# Patient Record
Sex: Male | Born: 1995 | Race: Black or African American | Hispanic: No | Smoking: Never smoker
Health system: Southern US, Community
[De-identification: ages and names within clinical notes are randomized; demographics above are authoritative.]

## PROBLEM LIST (undated history)

## (undated) DIAGNOSIS — J45909 Unspecified asthma, uncomplicated: Secondary | ICD-10-CM

## (undated) DIAGNOSIS — I517 Cardiomegaly: Secondary | ICD-10-CM

## (undated) HISTORY — PX: FINGER SURGERY: SHX640

---
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
## 2013-11-15 ENCOUNTER — Encounter (HOSPITAL_COMMUNITY): Payer: Self-pay | Admitting: Emergency Medicine

## 2013-11-15 ENCOUNTER — Emergency Department (HOSPITAL_COMMUNITY)
Admission: EM | Admit: 2013-11-15 | Discharge: 2013-11-15 | Disposition: A | Discharge status: Home / Self Care | Payer: Medicaid Other | Attending: Emergency Medicine | Admitting: Emergency Medicine

## 2013-11-15 DIAGNOSIS — Z202 Contact with and (suspected) exposure to infections with a predominantly sexual mode of transmission: Secondary | ICD-10-CM | POA: Insufficient documentation

## 2013-11-15 LAB — RPR: RPR: NONREACTIVE

## 2013-11-15 MED ORDER — PENICILLIN G BENZATHINE 1200000 UNIT/2ML IM SUSP
2.4000 10*6.[IU] | Freq: Once | INTRAMUSCULAR | Status: AC
  Administered 2013-11-15: 2.4 10*6.[IU] via INTRAMUSCULAR
  Filled 2013-11-15: qty 4

## 2013-11-15 NOTE — Discharge Instructions (Signed)
No intercourse for a week. Your tests will be back in 2-3 days. You will be called if abnormal.    Sexually Transmitted Disease A sexually transmitted disease (STD) is a disease or infection that may be passed (transmitted) from person to person, usually during sexual activity. This may happen by way of saliva, semen, blood, vaginal mucus, or urine. Common STDs include:   Gonorrhea.   Chlamydia.   Syphilis.   HIV and AIDS.   Genital herpes.   Hepatitis B and C.   Trichomonas.   Human papillomavirus (HPV).   Pubic lice.   Scabies.  Mites.  Bacterial vaginosis. WHAT ARE CAUSES OF STDs? An STD may be caused by bacteria, a virus, or parasites. STDs are often transmitted during sexual activity if one person is infected. However, they may also be transmitted through nonsexual means. STDs may be transmitted after:   Sexual intercourse with an infected person.   Sharing sex toys with an infected person.   Sharing needles with an infected person or using unclean piercing or tattoo needles.  Having intimate contact with the genitals, mouth, or rectal areas of an infected person.   Exposure to infected fluids during birth. WHAT ARE THE SIGNS AND SYMPTOMS OF STDs? Different STDs have different symptoms. Some people may not have any symptoms. If symptoms are present, they may include:   Painful or bloody urination.   Pain in the pelvis, abdomen, vagina, anus, throat, or eyes.   Skin rash, itching, irritation, growths, sores (lesions), ulcerations, or warts in the genital or anal area.  Abnormal vaginal discharge with or without bad odor.   Penile discharge in men.   Fever.   Pain or bleeding during sexual intercourse.   Swollen glands in the groin area.   Yellow skin and eyes (jaundice). This is seen with hepatitis.   Swollen testicles.  Infertility.  Sores and blisters in the mouth. HOW ARE STDs DIAGNOSED? To make a diagnosis, your health  care provider may:   Take a medical history.   Perform a physical exam.   Take a sample of any discharge for examination.  Swab the throat, cervix, opening to the penis, rectum, or vagina for testing.  Test a sample of your first morning urine.   Perform blood tests.   Perform a Pap smear, if this applies.   Perform a colposcopy.   Perform a laparoscopy.  HOW ARE STDs TREATED? Treatment depends on the STD. Some STDs may be treated but not cured.   Chlamydia, gonorrhea, trichomonas, and syphilis can be cured with antibiotics.   Genital herpes, hepatitis, and HIV can be treated, but not cured, with prescribed medicines. The medicines lessen symptoms.   Genital warts from HPV can be treated with medicine or by freezing, burning (electrocautery), or surgery. Warts may come back.   HPV cannot be cured with medicine or surgery. However, abnormal areas may be removed from the cervix, vagina, or vulva.   If your diagnosis is confirmed, your recent sexual partners need treatment. This is true even if they are symptom-free or have a negative culture or evaluation. They should not have sex until their health care providers say it is OK. HOW CAN I REDUCE MY RISK OF GETTING AN STD?  Use latex condoms, dental dams, and water-soluble lubricants during sexual activity. Do not use petroleum jelly or oils.  Get vaccinated for HPV and hepatitis. If you have not received these vaccines in the past, talk to your health care provider about whether  one or both might be right for you.   Avoid risky sex practices that can break the skin.  WHAT SHOULD I DO IF I THINK I HAVE AN STD?  See your health care provider.   Inform all sexual partners. They should be tested and treated for any STDs.  Do not have sex until your health care provider says it is OK. WHEN SHOULD I GET HELP? Seek immediate medical care if:  You develop severe abdominal pain.  You are a man and notice swelling  or pain in the testicles.  You are a woman and notice swelling or pain in your vagina. Document Released: 12/16/2002 Document Revised: 07/16/2013 Document Reviewed: 04/15/2013 [facility] CenterExitCare Patient Information 2014 [place]ExitCare, MarylandLLC.

## 2013-11-15 NOTE — ED Provider Notes (Signed)
CSN: [id number]     Arrival date & time 11/15/13  1516 History  This chart was scribed for Jaynie Crumbleatyana Kenzee Bassin, PA working with Flint MelterElliott L Wentz, MD by Quintella ReichertMatthew Underwood, ED Scribe. This patient was seen in room TR06C/TR06C and the patient's care was started at 3:46 PM.   Chief Complaint  Patient presents with  . Exposure to STD    The history is provided by the patient. No language interpreter was used.    HPI Comments: Alan Foley is a 18 y.o. male who presents to the Emergency Department complaining of possible exposure to syphilis.  Pt states that his girlfriend was recently told by her doctor that she may have syphilis.  He has had sexual intercourse with her recently.  Currently he denies any symptoms including penile discharge, itching, urinary symptoms, or pain to any area.   History reviewed. No pertinent past medical history.  History reviewed. No pertinent past surgical history.  History reviewed. No pertinent family history.   History  Substance Use Topics  . Smoking status: Not on file  . Smokeless tobacco: Not on file  . Alcohol Use: No     Review of Systems  Constitutional: Negative for fever.  Gastrointestinal: Negative for vomiting and abdominal pain.  Genitourinary: Negative for dysuria, frequency, hematuria, discharge, penile swelling, scrotal swelling, difficulty urinating, genital sores, penile pain and testicular pain.     Allergies  Review of patient's allergies indicates no known allergies.  Home Medications  No current outpatient prescriptions on file.  BP 145/79  Pulse 104  Temp(Src) 98.8 F (37.1 C) (Oral)  Resp 20  SpO2 100%  Physical Exam  Nursing note and vitals reviewed. Constitutional: He is oriented to person, place, and time. He appears well-developed and well-nourished. No distress.  HENT:  Head: Normocephalic and atraumatic.  Eyes: EOM are normal.  Neck: Neck supple. No tracheal deviation present.  Cardiovascular: Normal rate.    Pulmonary/Chest: Effort normal. No respiratory distress.  Genitourinary: Penis normal. No penile tenderness.  No penile discharge. No perineum lesions  Musculoskeletal: Normal range of motion.  Neurological: He is alert and oriented to person, place, and time.  Skin: Skin is warm and dry.  Psychiatric: He has a normal mood and affect. His behavior is normal.    ED Course  Procedures (including critical care time)  DIAGNOSTIC STUDIES: Oxygen Saturation is 100% on room air, normal by my interpretation.    COORDINATION OF CARE: 3:50 PM-Discussed treatment plan which includes Penicillin injection and labs with pt at bedside and pt agreed to plan.    Labs Review Labs Reviewed  GC/CHLAMYDIA PROBE AMP  RPR    Imaging Review No results found.  EKG Interpretation   None       MDM   1. Exposure to syphilis     Patient emergency department after his girlfriend told him that she tested positive for syphilis. I reviewed CDC guidelines, which recommend prophylactic treatment for exposed patient's. I did get RPR cultures, GC Chlamydia cultures. I treated him with 2.4 million units of Bicillin IM. Safe sex practices discussed. Discharge home with health department followup.  Filed Vitals:   11/15/13 1519 11/15/13 1521 11/15/13 1629  BP:  145/79 125/69  Pulse: 104  91  Temp: 98.8 F (37.1 C)  98.1 F (36.7 C)  TempSrc: Oral  Oral  Resp: 20  20  SpO2: 100%  99%      Lottie Musselatyana A Alejandria Wessells, PA-C 11/15/13 1644

## 2013-11-15 NOTE — ED Notes (Signed)
Pt reports possible exposure to syphillis, denies any penile discharge or symptoms.

## 2013-11-16 NOTE — ED Provider Notes (Signed)
Medical screening examination/treatment/procedure(s) were performed by non-physician practitioner and as supervising physician I was immediately available for consultation/collaboration.  Filed Vitals:   11/15/13 1519 11/15/13 1521 11/15/13 1629  BP:  145/79 125/69  Pulse: 104  91  Temp: 98.8 F (37.1 C)  98.1 F (36.7 C)  TempSrc: Oral  Oral  Resp: 20  20  SpO2: 100%  99%    Flint MelterElliott L Audryana Hockenberry, MD 11/16/13 [phone]

## 2013-11-17 LAB — GC/CHLAMYDIA PROBE AMP
CT Probe RNA: NEGATIVE
GC Probe RNA: NEGATIVE

## 2014-02-27 ENCOUNTER — Encounter (HOSPITAL_COMMUNITY): Payer: Self-pay | Admitting: Emergency Medicine

## 2014-02-27 ENCOUNTER — Emergency Department (HOSPITAL_COMMUNITY): Payer: Medicaid Other

## 2014-02-27 DIAGNOSIS — R079 Chest pain, unspecified: Secondary | ICD-10-CM | POA: Insufficient documentation

## 2014-02-27 DIAGNOSIS — M546 Pain in thoracic spine: Secondary | ICD-10-CM | POA: Insufficient documentation

## 2014-02-27 DIAGNOSIS — R29898 Other symptoms and signs involving the musculoskeletal system: Secondary | ICD-10-CM | POA: Insufficient documentation

## 2014-02-27 DIAGNOSIS — M25519 Pain in unspecified shoulder: Secondary | ICD-10-CM | POA: Insufficient documentation

## 2014-02-27 DIAGNOSIS — Z8679 Personal history of other diseases of the circulatory system: Secondary | ICD-10-CM | POA: Insufficient documentation

## 2014-02-27 LAB — BASIC METABOLIC PANEL
BUN: 11 mg/dL (ref 6–23)
CALCIUM: 10.1 mg/dL (ref 8.4–10.5)
CO2: 27 meq/L (ref 19–32)
CREATININE: 1.31 mg/dL (ref 0.50–1.35)
Chloride: 102 mEq/L (ref 96–112)
GFR calc Af Amer: 90 mL/min — ABNORMAL LOW (ref >90)
GFR, EST NON AFRICAN AMERICAN: 77 mL/min — AB (ref >90)
GLUCOSE: 92 mg/dL (ref 70–99)
Potassium: 3.7 mEq/L (ref 3.7–5.3)
SODIUM: 142 meq/L (ref 137–147)

## 2014-02-27 LAB — CBC
HEMATOCRIT: 41.2 % (ref 39.0–52.0)
HEMOGLOBIN: 14.9 g/dL (ref 13.0–17.0)
MCH: 23 pg — AB (ref 26.0–34.0)
MCHC: 36.2 g/dL — AB (ref 30.0–36.0)
MCV: 63.5 fL — ABNORMAL LOW (ref 78.0–100.0)
Platelets: 218 10*3/uL (ref 150–400)
RBC: 6.49 MIL/uL — ABNORMAL HIGH (ref 4.22–5.81)
RDW: 15.7 % — AB (ref 11.5–15.5)
WBC: 10.5 10*3/uL (ref 4.0–10.5)

## 2014-02-27 LAB — PRO B NATRIURETIC PEPTIDE: Pro B Natriuretic peptide (BNP): 28.4 pg/mL (ref 0–125)

## 2014-02-27 LAB — I-STAT TROPONIN, ED: Troponin i, poc: 0 ng/mL (ref 0.00–0.08)

## 2014-02-27 NOTE — ED Notes (Signed)
Pt states for about three weeks he has been having upper back pain and some tingling going in to left arm.  Pt states he is also having some chest pain once asked

## 2014-02-28 ENCOUNTER — Emergency Department (HOSPITAL_COMMUNITY)
Admission: EM | Admit: 2014-02-28 | Discharge: 2014-02-28 | Disposition: A | Discharge status: Home / Self Care | Payer: Medicaid Other | Attending: Emergency Medicine | Admitting: Emergency Medicine

## 2014-02-28 DIAGNOSIS — R079 Chest pain, unspecified: Secondary | ICD-10-CM

## 2014-02-28 HISTORY — DX: Cardiomegaly: I51.7

## 2014-02-28 NOTE — Discharge Instructions (Signed)
Chest Pain (Nonspecific) °It is often hard to give a specific diagnosis for the cause of chest pain. There is always a chance that your pain could be related to something serious, such as a heart attack or a blood clot in the lungs. You need to follow up with your caregiver for further evaluation. °CAUSES  °· Heartburn. °· Pneumonia or bronchitis. °· Anxiety or stress. °· Inflammation around your heart (pericarditis) or lung (pleuritis or pleurisy). °· A blood clot in the lung. °· A collapsed lung (pneumothorax). It can develop suddenly on its own (spontaneous pneumothorax) or from injury (trauma) to the chest. °· Shingles infection (herpes zoster virus). °The chest wall is composed of bones, muscles, and cartilage. Any of these can be the source of the pain. °· The bones can be bruised by injury. °· The muscles or cartilage can be strained by coughing or overwork. °· The cartilage can be affected by inflammation and become sore (costochondritis). °DIAGNOSIS  °Lab tests or other studies, such as X-rays, electrocardiography, stress testing, or cardiac imaging, may be needed to find the cause of your pain.  °TREATMENT  °· Treatment depends on what may be causing your chest pain. Treatment may include: °· Acid blockers for heartburn. °· Anti-inflammatory medicine. °· Pain medicine for inflammatory conditions. °· Antibiotics if an infection is present. °· You may be advised to change lifestyle habits. This includes stopping smoking and avoiding alcohol, caffeine, and chocolate. °· You may be advised to keep your head raised (elevated) when sleeping. This reduces the chance of acid going backward from your stomach into your esophagus. °· Most of the time, nonspecific chest pain will improve within 2 to 3 days with rest and mild pain medicine. °HOME CARE INSTRUCTIONS  °· If antibiotics were prescribed, take your antibiotics as directed. Finish them even if you start to feel better. °· For the next few days, avoid physical  activities that bring on chest pain. Continue physical activities as directed. °· Do not smoke. °· Avoid drinking alcohol. °· Only take over-the-counter or prescription medicine for pain, discomfort, or fever as directed by your caregiver. °· Follow your caregiver's suggestions for further testing if your chest pain does not go away. °· Keep any follow-up appointments you made. If you do not go to an appointment, you could develop lasting (chronic) problems with pain. If there is any problem keeping an appointment, you must call to reschedule. °SEEK MEDICAL CARE IF:  °· You think you are having problems from the medicine you are taking. Read your medicine instructions carefully. °· Your chest pain does not go away, even after treatment. °· You develop a rash with blisters on your chest. °SEEK IMMEDIATE MEDICAL CARE IF:  °· You have increased chest pain or pain that spreads to your arm, neck, jaw, back, or abdomen. °· You develop shortness of breath, an increasing cough, or you are coughing up blood. °· You have severe back or abdominal pain, feel nauseous, or vomit. °· You develop severe weakness, fainting, or chills. °· You have a fever. °THIS IS AN EMERGENCY. Do not wait to see if the pain will go away. Get medical help at once. Call your local emergency services (911 in [place] Do not drive yourself to the hospital. °MAKE SURE YOU:  °· Understand these instructions. °· Will watch your condition. °· Will get help right away if you are not doing well or get worse. °Document Released: 07/05/2005 Document Revised: 12/18/2011 Document Reviewed: 04/30/2008 °ExitCare® Patient Information ©2014 [employer],   LLC. ° °

## 2014-02-28 NOTE — ED Notes (Signed)
Patient states he started feeling bed and his left arm was tight.  States he has a history of an enlarged heart but does not follow up with anyone.  States he was just walking into the house and started feeling bad.  While during exam patient was talking on the phone.  No distress noted at this time.

## 2014-02-28 NOTE — ED Provider Notes (Signed)
CSN: [id number]     Arrival date & time 02/27/14  2147 History   None    Chief Complaint  Patient presents with  . Extremity Weakness  . Chest Pain     (Consider location/radiation/quality/duration/timing/severity/associated sxs/prior Treatment) HPI Patient is a 18 yo man who presents with complaints of "my heart was hurting". Patient has had intermittent and migratory chest pains for the past 3 weeks. Sometimes his upper back hurts. Sometimes he has pain in his left upper arm. Denies any trauma or strain. No SOB, cough or fever.   Patient is asymptomatic at this time. He says, "I think I am really just hungry". No history of similar sx. Nothing excerbates or relieves symptoms.   Past Medical History  Diagnosis Date  . Enlarged heart    History reviewed. No pertinent past surgical history. No family history on file. History  Substance Use Topics  . Smoking status: Never Smoker   . Smokeless tobacco: Not on file  . Alcohol Use: No    Review of Systems Ten point review of symptoms performed and is negative with the exception of symptoms noted above.     Allergies  Review of patient's allergies indicates no known allergies.  Home Medications   Prior to Admission medications   Not on File   BP 139/66  Pulse 79  Temp(Src) 98.4 F (36.9 C) (Oral)  Resp 18  Wt 240 lb 4.8 oz (108.999 kg)  SpO2 97% Physical Exam Gen: well developed and well nourished appearing Head: NCAT Eyes: PERL, EOMI Nose: no epistaixis or rhinorrhea Mouth/throat: mucosa is moist and pink Neck: supple, no stridor Lungs: CTA B, no wheezing, rhonchi or rales Chest wall: ttp on palpation of the left pectoralis musculature with reproducible pain on external rotation of the left shoulder.  CV: regular rate and rythm, good distal pulses.  Abd: soft, notender, nondistended Back: no ttp, no cva ttp Skin: warm and dry Ext: no edema, normal to inspection Neuro: CN ii-xii grossly intact, no focal  deficits Psyche; normal affect,  calm and cooperative.  ED Course  Procedures (including critical care time) Labs Review  Results for orders placed during the hospital encounter of 02/28/14 (from the past 24 hour(s))  CBC     Status: Abnormal   Collection Time    02/27/14 10:25 PM      Result Value Ref Range   WBC 10.5  4.0 - 10.5 K/uL   RBC 6.49 (*) 4.22 - 5.81 MIL/uL   Hemoglobin 14.9  13.0 - 17.0 g/dL   HCT 55.7  32.2 - 02.5 %   MCV 63.5 (*) 78.0 - 100.0 fL   MCH 23.0 (*) 26.0 - 34.0 pg   MCHC 36.2 (*) 30.0 - 36.0 g/dL   RDW 42.7 (*) 06.2 - 37.6 %   Platelets 218  150 - 400 K/uL  PRO B NATRIURETIC PEPTIDE     Status: None   Collection Time    02/27/14 10:25 PM      Result Value Ref Range   Pro B Natriuretic peptide (BNP) 28.4  0 - 125 pg/mL  BASIC METABOLIC PANEL     Status: Abnormal   Collection Time    02/27/14 10:25 PM      Result Value Ref Range   Sodium 142  137 - 147 mEq/L   Potassium 3.7  3.7 - 5.3 mEq/L   Chloride 102  96 - 112 mEq/L   CO2 27  19 - 32 mEq/L   Glucose,  Bld 92  70 - 99 mg/dL   BUN 11  6 - 23 mg/dL   Creatinine, Ser 1.611.31  0.50 - 1.35 mg/dL   Calcium 09.610.1  8.4 - 04.510.5 mg/dL   GFR calc non Af Amer 77 (*) >90 mL/min   GFR calc Af Amer 90 (*) >90 mL/min  I-STAT TROPOININ, ED     Status: None   Collection Time    02/27/14 10:39 PM      Result Value Ref Range   Troponin i, poc 0.00  0.00 - 0.08 ng/mL   Comment 3             Imaging Review Dg Chest 2 View  02/27/2014   CLINICAL DATA:  Extremity weakness and chest pain.  EXAM: CHEST  2 VIEW  COMPARISON:  Chest radiograph performed 06/15/2011  FINDINGS: The lungs are well-aerated and clear. There is no evidence of focal opacification, pleural effusion or pneumothorax.  The heart is normal in size; the mediastinal contour is within normal limits. No acute osseous abnormalities are seen.  IMPRESSION: No acute cardiopulmonary process seen.   Electronically Signed   By: Roanna RaiderJeffery  Chang M.D.   On: 02/27/2014  23:31    EKG: nsr, no acute ischemic changes, normal intervals, normal axis, normal qrs complex  MDM   DDX: chest wall pain, PTA, PNA, pericarditis, pleural effusion.   The patient has a normal CXR and EKG ruling out pericarditis, pta, pna. His pain is reproducible on examination, strengthening the diagnosis of chest wall pain. He is pain free and laughing with his friend. I believe that he is stable for discharge with plan to f/u with his PCP on Tuesday.      Brandt LoosenJulie Yesmin Mutch, MD 02/28/14 [phone]

## 2014-02-28 NOTE — ED Notes (Signed)
Discharged inst given.  Patient voiced understanding.

## 2014-06-26 ENCOUNTER — Encounter (HOSPITAL_COMMUNITY): Payer: Self-pay | Admitting: Emergency Medicine

## 2014-06-26 ENCOUNTER — Emergency Department (HOSPITAL_COMMUNITY)
Admission: EM | Admit: 2014-06-26 | Discharge: 2014-06-26 | Disposition: A | Discharge status: Home / Self Care | Payer: Self-pay | Attending: Emergency Medicine | Admitting: Emergency Medicine

## 2014-06-26 DIAGNOSIS — M545 Low back pain, unspecified: Secondary | ICD-10-CM | POA: Insufficient documentation

## 2014-06-26 DIAGNOSIS — Z8679 Personal history of other diseases of the circulatory system: Secondary | ICD-10-CM | POA: Insufficient documentation

## 2014-06-26 NOTE — ED Notes (Signed)
Patient having no problems at present.  Patient states hurt back and has stayed out of work x 1 month and now needs an excuse for work.

## 2014-06-26 NOTE — ED Notes (Signed)
Pt sts he injured his back 1 month ago and needs to be cleared to go back to work. sts back is better.

## 2014-06-26 NOTE — ED Provider Notes (Signed)
CSN: [id number]     Arrival date & time 06/26/14  1206 History  This chart was scribed for non-physician practitioner Terri Piedra, PA-C working with Hilario Quarry, MD by Littie Deeds, ED Scribe. This patient was seen in room TR04C/TR04C and the patient's care was started at 2:15 PM.     Chief Complaint  Patient presents with  . Back Problem      The history is provided by the patient. No language interpreter was used.   HPI Comments: Alan Foley is a 18 y.o. male who presents to the Emergency Department complaining of a back pain that occurred 1 month ago and is seeking clearance to return to work. He notes occasional pain in the past when picking up heavy stuff or bending his neck, but states that his back is better.  His pain was lumbar and central in location and was intermittent.   He denies fever, nausea, chills, vomiting, tingling or numbness in the GU area, issues with his bones, and incontinence. Patient denies IVDA and Hx of cancer.   Past Medical History  Diagnosis Date  . Enlarged heart    History reviewed. No pertinent past surgical history. History reviewed. No pertinent family history. History  Substance Use Topics  . Smoking status: Never Smoker   . Smokeless tobacco: Not on file  . Alcohol Use: No    Review of Systems See HPI. A complete 10 system review of systems was obtained and all systems are negative except as noted in the HPI and PMH.     Allergies  Review of patient's allergies indicates no known allergies.  Home Medications   Prior to Admission medications   Not on File   BP 136/61  Pulse 90  Temp(Src) 98.3 F (36.8 C)  Resp 18  SpO2 98% Physical Exam  Nursing note and vitals reviewed. Constitutional: He is oriented to person, place, and time. He appears well-developed and well-nourished. No distress.  HENT:  Head: Normocephalic and atraumatic.  Mouth/Throat: Oropharynx is clear and moist. No oropharyngeal exudate.  Eyes:  Conjunctivae and EOM are normal. Pupils are equal, round, and reactive to light. No scleral icterus.  Neck: Normal range of motion. Neck supple. No JVD present. No thyromegaly present.  Cardiovascular: Normal rate, regular rhythm, normal heart sounds and intact distal pulses.  Exam reveals no gallop and no friction rub.   No murmur heard. Pulmonary/Chest: Effort normal and breath sounds normal. No respiratory distress. He has no wheezes. He has no rales. He exhibits no tenderness.  Musculoskeletal: He exhibits no edema.  Patient rises slowly from sitting to standing.  They walk without an antalgic gait.  There is no evidence of erythema, ecchymosis, or gross deformity.  There is no tenderness to palpation.  Active ROM full.  Sensation to light touch is intact over all extremities.  Strength is symmetric and equal in all extremities.    Lymphadenopathy:    He has no cervical adenopathy.  Neurological: He is alert and oriented to person, place, and time. He has normal strength. No cranial nerve deficit or sensory deficit. Coordination normal.  Skin: Skin is warm and dry. No rash noted.  Psychiatric: He has a normal mood and affect. His behavior is normal. Judgment and thought content normal.    ED Course  Procedures  DIAGNOSTIC STUDIES: Oxygen Saturation is 98% on RA, nml by my interpretation.    COORDINATION OF CARE: 2:23 PM-Medical clearance given to pt at bedside and pt agreed to plan.  Labs Review Labs Reviewed - No data to display  Imaging Review No results found.   EKG Interpretation None      MDM   Final diagnoses:  Midline low back pain without sciatica   Patient is a 18 y.o. Male who presents to the ED seeking medical clearance for previous back injury.  There are no red flags on history.  Physical exam is without abnormalities or neurological deficits.  Patient is stable for discharge at this time.  Will provide a note medically clearing him for work.   I personally  performed the services described in this documentation, which was scribed in my presence. The recorded information has been reviewed and is accurate.    Eben Burow, PA-C 06/26/14 1430

## 2014-06-26 NOTE — Discharge Instructions (Signed)
Back Injury Prevention Back injuries can be extremely painful and difficult to heal. After having one back injury, you are much more likely to experience another later on. It is important to learn how to avoid injuring or re-injuring your back. The following tips can help you to prevent a back injury. PHYSICAL FITNESS  Exercise regularly and try to develop good tone in your abdominal muscles. Your abdominal muscles provide a lot of the support needed by your back.  Do aerobic exercises (walking, jogging, biking, swimming) regularly.  Do exercises that increase balance and strength (tai chi, yoga) regularly. This can decrease your risk of falling and injuring your back.  Stretch before and after exercising.  Maintain a healthy weight. The more you weigh, the more stress is placed on your back. For every pound of weight, 10 times that amount of pressure is placed on the back. DIET  Talk to your caregiver about how much calcium and vitamin D you need per day. These nutrients help to prevent weakening of the bones (osteoporosis). Osteoporosis can cause broken (fractured) bones that lead to back pain.  Include good sources of calcium in your diet, such as dairy products, green, leafy vegetables, and products with calcium added (fortified).  Include good sources of vitamin D in your diet, such as milk and foods that are fortified with vitamin D.  Consider taking a nutritional supplement or a multivitamin if needed.  Stop smoking if you smoke. POSTURE  Sit and stand up straight. Avoid leaning forward when you sit or hunching over when you stand.  Choose chairs with good low back (lumbar) support.  If you work at a desk, sit close to your work so you do not need to lean over. Keep your chin tucked in. Keep your neck drawn back and elbows bent at a right angle. Your arms should look like the letter "L."  Sit high and close to the steering wheel when you drive. Add a lumbar support to your car  seat if needed.  Avoid sitting or standing in one position for too long. Take breaks to get up, stretch, and walk around at least once every hour. Take breaks if you are driving for long periods of time.  Sleep on your side with your knees slightly bent, or sleep on your back with a pillow under your knees. Do not sleep on your stomach. LIFTING, TWISTING, AND REACHING  Avoid heavy lifting, especially repetitive lifting. If you must do heavy lifting:  Stretch before lifting.  Work slowly.  Rest between lifts.  Use carts and dollies to move objects when possible.  Make several small trips instead of carrying 1 heavy load.  Ask for help when you need it.  Ask for help when moving big, awkward objects.  Follow these steps when lifting:  Stand with your feet shoulder-width apart.  Get as close to the object as you can. Do not try to pick up heavy objects that are far from your body.  Use handles or lifting straps if they are available.  Bend at your knees. Squat down, but keep your heels off the floor.  Keep your shoulders pulled back, your chin tucked in, and your back straight.  Lift the object slowly, tightening the muscles in your legs, abdomen, and buttocks. Keep the object as close to the center of your body as possible.  When you put a load down, use these same guidelines in reverse.  Do not:  Lift the object above your waist.  Twist at the waist while lifting or carrying a load. Move your feet if you need to turn, not your waist.  Bend over without bending at your knees.  Avoid reaching over your head, across a table, or for an object on a high surface. OTHER TIPS  Avoid wet floors and keep sidewalks clear of ice to prevent falls.  Do not sleep on a mattress that is too soft or too hard.  Keep items that are used frequently within easy reach.  Put heavier objects on shelves at waist level and lighter objects on lower or higher shelves.  Find ways to  decrease your stress, such as exercise, massage, or relaxation techniques. Stress can build up in your muscles. Tense muscles are more vulnerable to injury.  Seek treatment for depression or anxiety if needed. These conditions can increase your risk of developing back pain. SEEK MEDICAL CARE IF:  You injure your back.  You have questions about diet, exercise, or other ways to prevent back injuries. MAKE SURE YOU:  Understand these instructions.  Will watch your condition.  Will get help right away if you are not doing well or get worse. Document Released: 11/02/2004 Document Revised: 12/18/2011 Document Reviewed: 11/06/2011 [facility] Patient Information 2015 [place], [place]. This information is not intended to replace advice given to you by your health care provider. Make sure you discuss any questions you have with your health care provider.   Emergency Department Resource Guide 1) Find a Doctor and Pay Out of Pocket Although you won't have to find out who is covered by your insurance plan, it is a good idea to ask around and get recommendations. You will then need to call the office and see if the doctor you have chosen will accept you as a new patient and what types of options they offer for patients who are self-pay. Some doctors offer discounts or will set up payment plans for their patients who do not have insurance, but you will need to ask so you aren't surprised when you get to your appointment.  2) Contact Your Local Health Department Not all health departments have doctors that can see patients for sick visits, but many do, so it is worth a call to see if yours does. If you don't know where your local health department is, you can check in your phone book. The CDC also has a tool to help you locate your state's health department, and many state websites also have listings of all of their local health departments.  3) Find a [facility] If your illness is not likely to be very  severe or complicated, you may want to try a walk in clinic. These are popping up all over the country in pharmacies, drugstores, and shopping centers. They're usually staffed by nurse practitioners or physician assistants that have been trained to treat common illnesses and complaints. They're usually fairly quick and inexpensive. However, if you have serious medical issues or chronic medical problems, these are probably not your best option.  No Primary Care Doctor: - Call Health Connect at  [phone] - they can help you locate a primary care doctor that  accepts your insurance, provides certain services, etc. - Physician Referral Service- [phone]  Chronic Pain Problems: Organization         Address  Phone   Notes  [facility]  [phone] Patients need to be referred by their primary care doctor.   Medication Assistance: Organization  Address  Phone   Notes  [facility] Medication [facility] [address] [place], [place] [address] [phone] --Must be a resident of [facility] -- Must have NO insurance coverage whatsoever (no Medicaid/ Medicare, etc.) -- The pt. MUST have a primary care doctor that directs their care regularly and follows them in the community   MedAssist  [phone]   [employer]  [phone]    Agencies that provide inexpensive medical care: Organization         Address  Phone   Notes  [place]  [phone]   [facility]    [phone]   [facility] [place], [place] [address] [phone]   [place] [address] [place] [phone]   Planned Parenthood    [phone]   [facility]    [phone]   [place] and [place] [address], [place] Phone:  [phone], Fax:  [phone] Hours of Operation:  9 am - 6 pm, M-F.  Also accepts  Medicaid/Medicare and self-pay.  [facility] for [place], [address], [place] Phone: [phone], Fax: [phone]. Hours of Operation:  8:30 am - 5:30 pm, M-F.  Also accepts Medicaid and self-pay.  [facility] [place] [address] [place] Phone: [phone]   [place], [place], [place] [phone], Ext. [phone] Mondays & Thursdays: 7-9 AM.  First 15 patients are seen on a first come, first serve basis.    [place] Providers:  Organization         Address  Phone   Notes  [facility] [address], [place] [phone] Also accepts self-pay patients.  [facility] [address], [place]  [phone]   [place], [address], [place] [phone]   [facility] [address] [place] [phone]   Lucianne Lei [address], [place]   [phone] Only accepts [place] patients after they have their name applied to their card.   Self-Pay (no insurance) in [facility]:  Organization         Address  Phone   Notes  Sickle Cell Patients, [facility] [place] [phone]   [facility] [place] [phone]   [facility] [place]  [place], [place], [place] [phone]   [facility]/Dr. Osei-Bonsu  [address], [place] or [address], [place] [phone] Phone number for both [place] and [place] locations is the same.  [facility] [address] [place] [phone]   [facility] [address], [place] or [address] [phone]   [facility] [address] [place] [phone], phone; [phone], fax Sees patients 1st and 3rd Saturday of every month.  Must not qualify for public or private insurance (i.e. Medicaid, Medicare, [place] Health Choice, Veterans' Benefits)  Household income should be no more than 200% of the poverty level The clinic cannot treat you if you are pregnant or think you  are pregnant  Sexually transmitted diseases are not treated at the clinic.    Dental Care: Organization         Address  Phone  Notes  [facility] of [facility] [address] [place], [place] [phone] Accepts children up to age 2 who are enrolled in [place] or [place] Health Choice; pregnant women with a Medicaid card; and children who have applied for Medicaid or [place] Health Choice, but were declined, whose parents can pay a reduced fee at time of service.  [facility]  [address], [place] [phone] Accepts children up to age 81 who are enrolled in [place] or [place] Health Choice; pregnant women with a Medicaid card; and children who have applied for Medicaid or [place] Health Choice, but were declined, whose parents can pay a reduced fee at time of service.  [facility]  [address] [place], [place] [phone] Patients are seen by appointment only. Walk-ins are not accepted. [facility] will see patients 77 years of age and older. Monday - Tuesday (8am-5pm) Most Wednesdays (8:30-5pm) $30 per visit, cash only  [facility] Adult Dental Access PROGRAM  [address], [facility] [place] [phone] Patients are seen by appointment only. Walk-ins are not accepted. [facility] will see patients 69 years of age and older. One Wednesday Evening (Monthly: Volunteer Based).  $30 per visit, cash only  [employer]  [phone] for adults; Children under age 64, call Graduate Pediatric Dentistry at [phone]. Children aged  9-14, please call [phone] to request a pediatric application.  Dental services are provided in all areas of dental care including fillings, crowns and bridges, complete and partial dentures, implants, gum treatment, root canals, and extractions. Preventive care is also provided. Treatment is provided to both adults and children. Patients are selected via a lottery and there is often a waiting list.   [facility] [address] [place]  [phone] [email or web address]   [facility] [address] [place], [place] [phone], Ext. [phone] Second and Fourth Thursday of each month, opens at 6:30 AM; Clinic ends at 9 AM.  Patients are seen on a first-come first-served basis, and a limited number are seen during each clinic.   [facility]  [address] [place], [place] [phone]   Eligibility Requirements You must have lived in [place], [place], or [place] counties for at least the last three months.   You cannot be eligible for state or federal sponsored [place], including [employer], [place], or [employer].   You generally cannot be eligible for healthcare insurance through your employer.    How to apply: Eligibility screenings are held every Tuesday and Wednesday afternoon from 1:00 pm until 4:00 pm. You do not need an appointment for the interview!  [facility] [address], [place], [place] [phone]   [facility] Health [facility]  [phone]   [facility] Health Department  [phone]   [facility]  [phone]    Behavioral Health Resources in the Community: Intensive Outpatient Programs Organization         Address  Phone  Notes  [facility] [address], [place], [place] [phone]   [facility] [address] [place], [place] [phone]   [facility]: [facility] [address],  [place], [place]   [facility] [address],  [place], [place] or [phone]   Substance Abuse Resources Organization         Address  Phone  Notes  [facility] Drug Services  [phone]   [place]  [phone]   [facility] [place]   Chinita Pester  [phone]   Residential & Outpatient Substance Abuse Program  [phone]   Psychological Services Organization         Address  Phone  Notes  [facility] [place]  [place]  [phone]   [place] [address], [place] or [phone]    Mobile Crisis Teams Organization         Address  Phone  Notes  [facility], Mobile Crisis Care Unit  [phone]   [facility]  [address] [place], [place]   [facility] [facility] [address] [place] [phone]    Self-Help/Support Groups Organization         Address  Phone             Notes  [place]. of [place] - variety of support groups  [place] Call for more information  Narcotics Anonymous (NA), [facility] [address], [employer] [place]  2 meetings at this location   Special educational needs teacher         Address  Phone  Notes  [facility] [place],    [place]  [phone]   [facility]  [address] [place] [phone], [place], [place]   [place], [place] [phone] Admissions: 8am-3pm M-F  Incentives Substance [place] [address],    [place], [place] [phone]   [facility] [address] [place], [place], [place]   [facility] [address],  [place], [place]   [facility] - Intensive Outpatient [address], [place], [place]   [facility]  ([place].,  [place], [place] [phone] or [phone]   [facility] ([facility]) [address] [place], [place] Accepts Medicaid  [facility] [place] [address],  [place] [phone] Substance Abuse/Addiction Treatment   [facility] Organization         Address  Phone  Notes  [facility]  [phone]   Domenic Schwab, PhD [address] [place], [place]   [phone] or [phone]   [place], [place] [phone]   [facility] [address], [place], [place] [phone] Insurance/Medicaid/sponsorship through [facility] and Families [address], [address] [place]                                    [place], [place] [phone] [place] [address], [place] [phone]    Dr. Adele Schilder  [phone]   [facility] [place] Dept. 1) [address] [place] 2) [place] 3)  [place] [address], [place] [phone]  [phone]   [place] [phone]  or [phone] (After Hours)

## 2014-06-27 NOTE — ED Provider Notes (Signed)
History/physical exam/procedure(s) were performed by non-physician practitioner and as supervising physician I was immediately available for consultation/collaboration. I have reviewed all notes and am in agreement with care and plan.   Hilario Quarry, MD 06/27/14 1009

## 2014-11-20 ENCOUNTER — Encounter (HOSPITAL_COMMUNITY): Payer: Self-pay | Admitting: Emergency Medicine

## 2014-11-20 ENCOUNTER — Emergency Department (HOSPITAL_COMMUNITY)
Admission: EM | Admit: 2014-11-20 | Discharge: 2014-11-20 | Disposition: A | Discharge status: Home / Self Care | Payer: Self-pay | Attending: Emergency Medicine | Admitting: Emergency Medicine

## 2014-11-20 DIAGNOSIS — Z711 Person with feared health complaint in whom no diagnosis is made: Secondary | ICD-10-CM

## 2014-11-20 DIAGNOSIS — Z202 Contact with and (suspected) exposure to infections with a predominantly sexual mode of transmission: Secondary | ICD-10-CM | POA: Insufficient documentation

## 2014-11-20 DIAGNOSIS — I517 Cardiomegaly: Secondary | ICD-10-CM | POA: Insufficient documentation

## 2014-11-20 MED ORDER — LIDOCAINE HCL (PF) 1 % IJ SOLN
0.9000 mL | Freq: Once | INTRAMUSCULAR | Status: AC
  Administered 2014-11-20: 0.9 mL
  Filled 2014-11-20: qty 5

## 2014-11-20 MED ORDER — AZITHROMYCIN 250 MG PO TABS
1000.0000 mg | ORAL_TABLET | Freq: Once | ORAL | Status: AC
  Administered 2014-11-20: 1000 mg via ORAL
  Filled 2014-11-20: qty 4

## 2014-11-20 MED ORDER — CEFTRIAXONE SODIUM 250 MG IJ SOLR
250.0000 mg | Freq: Once | INTRAMUSCULAR | Status: AC
  Administered 2014-11-20: 250 mg via INTRAMUSCULAR
  Filled 2014-11-20: qty 250

## 2014-11-20 NOTE — ED Notes (Signed)
Pt. requesting STD screening , reports penile tingling/spasms onset this evening after sexual encounter , denies discharge or dysuria .

## 2014-11-20 NOTE — ED Provider Notes (Signed)
CSN: [id number]     Arrival date & time 11/20/14  2141 History  This chart was scribed for non-physician practitioner, Dierdre Forth, PA-C, working with Rolland Porter, MD, by Bronson Curb, ED Scribe. This patient was seen in room TR06C/TR06C and the patient's care was started at 10:13 PM.   Chief Complaint  Patient presents with  . SEXUALLY TRANSMITTED DISEASE    The history is provided by the patient and medical records. No language interpreter was used.     HPI Comments: Alan Foley is a 19 y.o. male, with history of enlarged heart, who presents to the Emergency Department complaining of possible STD exposure. Patient reports he had unprotected sexual intercourse with a significant other approximately 4 hours ago. He states that shortly after this encounter, he received a text message from his partner stating she "gave him something", but later sent a following text stating that she was "just playing". Patient states that since receiving the text messages, he has been experiencing a burning, tingling sensation in his penis. He denies abdominal pain, fever, chills, testicular pain/swelling, penile discharge, or dysruria. He has been tested for STDs in the past. Patient is currently not on any medications, and has NKDA.   Past Medical History  Diagnosis Date  . Enlarged heart    History reviewed. No pertinent past surgical history. No family history on file. History  Substance Use Topics  . Smoking status: Never Smoker   . Smokeless tobacco: Not on file  . Alcohol Use: No    Review of Systems  Constitutional: Negative for fever, chills, diaphoresis, appetite change, fatigue and unexpected weight change.  HENT: Negative for mouth sores.   Eyes: Negative for visual disturbance.  Respiratory: Negative for cough, chest tightness, shortness of breath and wheezing.   Cardiovascular: Negative for chest pain.  Gastrointestinal: Negative for nausea, vomiting, abdominal pain, diarrhea  and constipation.  Endocrine: Negative for polydipsia, polyphagia and polyuria.  Genitourinary: Negative for dysuria, urgency, frequency, hematuria, discharge, scrotal swelling and testicular pain.  Musculoskeletal: Negative for back pain and neck stiffness.  Skin: Negative for rash.  Allergic/Immunologic: Negative for immunocompromised state.  Neurological: Negative for syncope, light-headedness and headaches.  Hematological: Does not bruise/bleed easily.  Psychiatric/Behavioral: Negative for sleep disturbance. The patient is not nervous/anxious.       Allergies  Review of patient's allergies indicates no known allergies.  Home Medications   Prior to Admission medications   Not on File   Triage Vitals: BP 139/87 mmHg  Pulse 75  Resp 18  SpO2 99%  Physical Exam  Constitutional: He appears well-developed and well-nourished. No distress.  Awake, alert, nontoxic appearance  HENT:  Head: Normocephalic and atraumatic.  Mouth/Throat: Oropharynx is clear and moist. No oropharyngeal exudate.  Eyes: Conjunctivae are normal. No scleral icterus.  Neck: Normal range of motion. Neck supple.  Cardiovascular: Normal rate, regular rhythm, normal heart sounds and intact distal pulses.   No murmur heard. Pulmonary/Chest: Effort normal and breath sounds normal. No respiratory distress. He has no wheezes.  Equal chest expansion  Abdominal: Soft. Bowel sounds are normal. He exhibits no distension and no mass. There is no tenderness. There is no rebound and no guarding. Hernia confirmed negative in the right inguinal area and confirmed negative in the left inguinal area.  Genitourinary: Testes normal and penis normal. Cremasteric reflex is present. Right testis shows no mass and no tenderness. Left testis shows no mass and no tenderness. Circumcised. No penile tenderness. No discharge found.  Musculoskeletal:  Normal range of motion. He exhibits no edema.  Lymphadenopathy:       Right: No inguinal  adenopathy present.       Left: No inguinal adenopathy present.  Neurological: He is alert.  Speech is clear and goal oriented Moves extremities without ataxia  Skin: Skin is warm and dry. He is not diaphoretic.  Psychiatric: He has a normal mood and affect.  Nursing note and vitals reviewed.   ED Course  Procedures (including critical care time)  DIAGNOSTIC STUDIES: Oxygen Saturation is 99% on room air, normal by my interpretation.    COORDINATION OF CARE: At 2218 Discussed treatment plan with patient which includes STD screening and ABX. Patient agrees.   Labs Review Labs Reviewed  GC/CHLAMYDIA PROBE AMP ([place])    Imaging Review No results found.   EKG Interpretation None      MDM   Final diagnoses:  Concern about STD in male without diagnosis   Alan Foley presents with concern about STD.  Patient is afebrile without abdominal tenderness, abdominal pain or painful bowel movements to indicate prostatitis.  No tenderness to palpation of the testes or epididymis to suggest orchitis or epididymitis.  STD cultures obtained including gonorrhea and chlamydia. Patient refused HIV and RPR.   Patient given azithromycin and Rocephin prophylactically. Patient to be discharged with instructions to follow up with PCP. Discussed importance of using protection when sexually active. Pt understands that they have GC/Chlamydia cultures pending and that they will need to inform all sexual partners if results return positive.   I have personally reviewed patient's vitals, nursing note and any pertinent labs or imaging.  I performed an focused physical exam; undressed when appropriate .    It has been determined that no acute conditions requiring further emergency intervention are present at this time. The patient/guardian have been advised of the diagnosis and plan. I reviewed any labs and imaging including any potential incidental findings. We have discussed signs and symptoms that  warrant return to the ED and they are listed in the discharge instructions.    Vital signs are stable at discharge.   BP 139/87 mmHg  Pulse 83  Temp(Src) 97.7 F (36.5 C) (Oral)  Resp 18  SpO2 96%  I personally performed the services described in this documentation, which was scribed in my presence. The recorded information has been reviewed and is accurate.   Dahlia ClientHannah Khalid Lacko, PA-C 11/20/14 2324  Rolland PorterMark James, MD 11/25/14 1630

## 2014-11-20 NOTE — Discharge Instructions (Signed)
1. Medications: usual home medications 2. Treatment: rest, drink plenty of fluids,  3. Follow Up: Please followup with your primary doctor in 3 days for discussion of your diagnoses and further evaluation after today's visit; if you do not have a primary care doctor use the resource guide provided to find one;   Sexually Transmitted Disease A sexually transmitted disease (STD) is a disease or infection that may be passed (transmitted) from person to person, usually during sexual activity. This may happen by way of saliva, semen, blood, vaginal mucus, or urine. Common STDs include:   Gonorrhea.   Chlamydia.   Syphilis.   HIV and AIDS.   Genital herpes.   Hepatitis B and C.   Trichomonas.   Human papillomavirus (HPV).   Pubic lice.   Scabies.  Mites.  Bacterial vaginosis. WHAT ARE CAUSES OF STDs? An STD may be caused by bacteria, a virus, or parasites. STDs are often transmitted during sexual activity if one person is infected. However, they may also be transmitted through nonsexual means. STDs may be transmitted after:   Sexual intercourse with an infected person.   Sharing sex toys with an infected person.   Sharing needles with an infected person or using unclean piercing or tattoo needles.  Having intimate contact with the genitals, mouth, or rectal areas of an infected person.   Exposure to infected fluids during birth. WHAT ARE THE SIGNS AND SYMPTOMS OF STDs? Different STDs have different symptoms. Some people may not have any symptoms. If symptoms are present, they may include:   Painful or bloody urination.   Pain in the pelvis, abdomen, vagina, anus, throat, or eyes.   A skin rash, itching, or irritation.  Growths, ulcerations, blisters, or sores in the genital and anal areas.  Abnormal vaginal discharge with or without bad odor.   Penile discharge in men.   Fever.   Pain or bleeding during sexual intercourse.   Swollen glands in  the groin area.   Yellow skin and eyes (jaundice). This is seen with hepatitis.   Swollen testicles.  Infertility.  Sores and blisters in the mouth. HOW ARE STDs DIAGNOSED? To make a diagnosis, your health care provider may:   Take a medical history.   Perform a physical exam.   Take a sample of any discharge to examine.  Swab the throat, cervix, opening to the penis, rectum, or vagina for testing.  Test a sample of your first morning urine.   Perform blood tests.   Perform a Pap test, if this applies.   Perform a colposcopy.   Perform a laparoscopy.  HOW ARE STDs TREATED? Treatment depends on the STD. Some STDs may be treated but not cured.   Chlamydia, gonorrhea, trichomonas, and syphilis can be cured with antibiotic medicine.   Genital herpes, hepatitis, and HIV can be treated, but not cured, with prescribed medicines. The medicines lessen symptoms.   Genital warts from HPV can be treated with medicine or by freezing, burning (electrocautery), or surgery. Warts may come back.   HPV cannot be cured with medicine or surgery. However, abnormal areas may be removed from the cervix, vagina, or vulva.   If your diagnosis is confirmed, your recent sexual partners need treatment. This is true even if they are symptom-free or have a negative culture or evaluation. They should not have sex until their health care providers say it is okay. HOW CAN I REDUCE MY RISK OF GETTING AN STD? Take these steps to reduce your risk of  getting an STD:  Use latex condoms, dental dams, and water-soluble lubricants during sexual activity. Do not use petroleum jelly or oils.  Avoid having multiple sex partners.  Do not have sex with someone who has other sex partners.  Do not have sex with anyone you do not know or who is at high risk for an STD.  Avoid risky sex practices that can break your skin.  Do not have sex if you have open sores on your mouth or skin.  Avoid  drinking too much alcohol or taking illegal drugs. Alcohol and drugs can affect your judgment and put you in a vulnerable position.  Avoid engaging in oral and anal sex acts.  Get vaccinated for HPV and hepatitis. If you have not received these vaccines in the past, talk to your health care provider about whether one or both might be right for you.   If you are at risk of being infected with HIV, it is recommended that you take a prescription medicine daily to prevent HIV infection. This is called pre-exposure prophylaxis (PrEP). You are considered at risk if:  You are a man who has sex with other men (MSM).  You are a heterosexual man or woman and are sexually active with more than one partner.  You take drugs by injection.  You are sexually active with a partner who has HIV.  Talk with your health care provider about whether you are at high risk of being infected with HIV. If you choose to begin PrEP, you should first be tested for HIV. You should then be tested every 3 months for as long as you are taking PrEP.  WHAT SHOULD I DO IF I THINK I HAVE AN STD?  See your health care provider.   Tell your sexual partner(s). They should be tested and treated for any STDs.  Do not have sex until your health care provider says it is okay. WHEN SHOULD I GET IMMEDIATE MEDICAL CARE? Contact your health care provider right away if:   You have severe abdominal pain.  You are a man and notice swelling or pain in your testicles.  You are a woman and notice swelling or pain in your vagina. Document Released: 12/16/2002 Document Revised: 09/30/2013 Document Reviewed: 04/15/2013 [facility] Patient Information 2015 [place], [place]. This information is not intended to replace advice given to you by your health care provider. Make sure you discuss any questions you have with your health care provider.    Emergency Department Resource Guide 1) Find a Doctor and Pay Out of Pocket Although you won't  have to find out who is covered by your insurance plan, it is a good idea to ask around and get recommendations. You will then need to call the office and see if the doctor you have chosen will accept you as a new patient and what types of options they offer for patients who are self-pay. Some doctors offer discounts or will set up payment plans for their patients who do not have insurance, but you will need to ask so you aren't surprised when you get to your appointment.  2) Contact Your Local Health Department Not all health departments have doctors that can see patients for sick visits, but many do, so it is worth a call to see if yours does. If you don't know where your local health department is, you can check in your phone book. The CDC also has a tool to help you locate your state's health department, and  many state websites also have listings of all of their local health departments.  3) Find a Walk-in Clinic If your illness is not likely to be very severe or complicated, you may want to try a walk in clinic. These are popping up all over the country in pharmacies, drugstores, and shopping centers. They're usually staffed by nurse practitioners or physician assistants that have been trained to treat common illnesses and complaints. They're usually fairly quick and inexpensive. However, if you have serious medical issues or chronic medical problems, these are probably not your best option.  No Primary Care Doctor: - Call Health Connect at  [phone] - they can help you locate a primary care doctor that  accepts your insurance, provides certain services, etc. - Physician Referral Service- [phone]  Chronic Pain Problems: Organization         Address  Phone   Notes  [facility]  [phone] Patients need to be referred by their primary care doctor.   Medication Assistance: Organization         Address  Phone   Notes  [facility] Medication [facility]  [address] [place] [place], [place] [address] [phone] --Must be a resident of [facility] -- Must have NO insurance coverage whatsoever (no Medicaid/ Medicare, etc.) -- The pt. MUST have a primary care doctor that directs their care regularly and follows them in the community   [facility]  [phone]   [employer]  [phone]    Agencies that provide inexpensive medical care: Organization         Address  Phone   Notes  [facility]  [phone]   [facility]    [phone]   [facility] [address] [place], [place] [address] [phone]   [facility] [address] [place], [place] [phone]   Planned Parenthood    [phone]   [facility]    [phone]   [facility]  [address], [place] Phone:  [phone], Fax:  [phone] Hours of Operation:  9 am - 6 pm, M-F.  Also accepts Medicaid/Medicare and self-pay.  [facility]  [address], [place] Phone: [phone], Fax: [phone]. Hours of Operation:  8:30 am - 5:30 pm, M-F.  Also accepts Medicaid and self-pay.  [facility] [address] [place] Phone: [phone]   [facility] [address] Natasha Bence [place], [place] [phone], Ext. [phone] Mondays & Thursdays: 7-9 AM.  First 15 patients are seen on a first come, first serve basis.    Medicaid-accepting [facility] Providers:  Organization         Address  Phone   Notes  [facility] [address], [place] [phone] Also accepts self-pay patients.  [facility] [address] Laurell Josephs [place], [place]  [phone]   [facility] [address], [place] [phone]   [facility] [address], [place] [phone]     Renaye Rakers [address], [place]   [phone] Only accepts [place] Access [place] patients after they have their name applied to their card.   Self-Pay (no insurance) in [facility]:  Organization  Address  Phone   Notes  Sickle Cell Patients, [facility] [place] [phone]   [facility] [place] [phone]   [facility] [place]  [place], [place] [phone]   [facility]/Dr. Osei-Bonsu  [address], [place] or [address], [place] [phone] Phone number for both [place] and [place] locations is the same.  [facility] and [facility] [address] [place] [phone]   [facility] [address] [place] or [address] [phone]   [facility] [address] [place] [phone], phone; [phone], fax Sees patients 1st and 3rd Saturday of every month.  Must not qualify for public or private insurance (i.e. Medicaid, Medicare, [place] Health Choice, Veterans' Benefits)  Household income should be no more than 200% of the poverty level The clinic cannot treat you if you are pregnant or think you are pregnant  Sexually transmitted diseases are not treated at the clinic.    Dental Care: Organization         Address  Phone  Notes  [facility] Department of [facility] [phone] Accepts children up to age 60 who are enrolled in [place] or [place]; pregnant women with a Medicaid card; and children who have applied for Medicaid or [place] Health Choice, but were declined, whose parents can pay a reduced fee at time of service.  [facility] Department of [facility]  [address], [place] [phone] Accepts children  up to age 88 who are enrolled in [place] or [place]; pregnant women with a Medicaid card; and children who have applied for Medicaid or [place] Health Choice, but were declined, whose parents can pay a reduced fee at time of service.  [facility] Access PRO[facility]  [place] [phone] Patients are seen by appointment only. Walk-ins are not accepted. [place] will see patients 71 years of age and older. Monday - Tuesday (8am-5pm) Most Wednesdays (8:30-5pm) $30 per visit, cash only   [facility] Adult Dental Access PROGRAM  [address], [facility] [phone] Patients are seen by appointment only. Walk-ins are not accepted. [place] will see patients 44 years of age and older. One Wednesday Evening (Monthly: Volunteer Based).  $30 per visit, cash only  [place]  [phone] for adults; Children under age 77, call Graduate Pediatric Dentistry at [phone]. Children aged 35-14, please call [phone] to request a pediatric application.  Dental services are provided in all areas of dental care including fillings, crowns and bridges, complete and partial dentures, implants, gum treatment, root canals, and extractions. Preventive care is also provided. Treatment is provided to both adults and children. Patients are selected via a lottery and there is often a waiting list.   [facility] [address], [place]  [phone] [email or web address]   [facility] [address] [place], [place] [phone], Ext. [phone] Second and Fourth Thursday of each month, opens at 6:30 AM; Clinic ends at 9 AM.  Patients are seen on a first-come first-served basis, and a limited number are seen during each clinic.   [facility]  [address] [place],  [place] [phone]   Eligibility Requirements You must have lived in [place], [place], or [place] counties for at least the last  three months.   You cannot be eligible for state or federal sponsored [employer], including [employer], [place], or [employer].   You generally cannot be eligible for healthcare insurance through your employer.    How to apply: Eligibility screenings are held every Tuesday and Wednesday afternoon from 1:00 pm until 4:00 pm. You do not need an appointment for the interview!  [facility] [address] [place], [place]   [place]  [place] Department  [place]  [phone]    Behavioral Health Resources in the Community: Intensive Outpatient Programs Organization         Address  Phone  Notes  [place]. [address] [place], [place] [phone]   [facility] Outpatient [address], [place], [place]   ADS: Alcohol [facility] Drug [facility] [address], [place], [place]   [place] [address]  [place], [place] or [phone]   Substance Abuse Resources Organization         Address  Phone  Notes  Alcohol and Drug Services  [phone]   [place]  [phone]   The Register   Chinita Pester  [phone]   Residential & Outpatient Substance Abuse Program  [phone]   Psychological Services Organization         Address  Phone  Notes  [facility]  [place]  [phone]   [place] [address], [place] or [phone]    Mobile Crisis Teams Organization         Address  Phone  Notes  [facility], Mobile Crisis Care Unit  [phone]   [facility]  [address] [place], [place]   [place] [address] [place] [phone]     Self-Help/Support Groups Organization         Address  Phone             Notes  [place]. of [place] - variety of support groups  [place] Call for more information  Narcotics Anonymous (NA), [facility] [address], [employer] [place]  2 meetings at this location   Special educational needs teacher         Address  Phone  Notes  [facility]idential [facility] [place],    [place]  [phone]   [facility]  [address], [place] [phone], [place], [place]   [place], [place] [phone] Admissions: 8am-3pm M-F  Incentives Substance [place] [address],    [place], [place] [phone]   [facility] [address] Jadene Pierini [place], [place]   [facility] [address],  [place], [place]   [facility] Programs - Intensive Outpatient [address] Kristeen [address], [place], [place]   [facility] ([place].) [place].,  [place], [place] or [phone]   [facility] (RTS) [address] [place], [place] Accepts Medicaid  [facility] [place] [address]  [place] [phone] Substance Abuse/Addiction Treatment   [facility] Resources Organization  Address  Phone  Notes  [facility]  [phone]   Domenic Schwab, PhD [address] Arlis Porta [place], [place]   [phone] or [phone]   [place], [place] [phone]   [address], [place], [place] [phone] Insurance/Medicaid/sponsorship through [facility] and Families [address], Ste [place]                                    [place], [place] [phone] [place] [address], [place] [phone]    Dr. Adele Schilder  [phone]   Free  Clinic of [place] Dept. 1) [address] [place] 2) [place] 3)  [place], [place] [phone]  [phone]   [place] [phone] or [phone] (After Hours)

## 2014-11-23 LAB — GC/CHLAMYDIA PROBE AMP (~~LOC~~) NOT AT ARMC
Chlamydia: NEGATIVE
Neisseria Gonorrhea: NEGATIVE

## 2015-08-08 ENCOUNTER — Emergency Department (HOSPITAL_COMMUNITY)
Admission: EM | Admit: 2015-08-08 | Discharge: 2015-08-08 | Disposition: A | Discharge status: Home / Self Care | Payer: Self-pay | Attending: Emergency Medicine | Admitting: Emergency Medicine

## 2015-08-08 ENCOUNTER — Encounter (HOSPITAL_COMMUNITY): Payer: Self-pay | Admitting: *Deleted

## 2015-08-08 ENCOUNTER — Emergency Department (HOSPITAL_COMMUNITY): Payer: Self-pay

## 2015-08-08 DIAGNOSIS — R079 Chest pain, unspecified: Secondary | ICD-10-CM | POA: Insufficient documentation

## 2015-08-08 LAB — BASIC METABOLIC PANEL
ANION GAP: 10 (ref 5–15)
Anion gap: 9 (ref 5–15)
BUN: 17 mg/dL (ref 6–20)
BUN: 16 mg/dL (ref 6–20)
CALCIUM: 9.1 mg/dL (ref 8.9–10.3)
CALCIUM: 8.8 mg/dL — AB (ref 8.9–10.3)
CHLORIDE: 104 mmol/L (ref 101–111)
CO2: 23 mmol/L (ref 22–32)
CO2: 26 mmol/L (ref 22–32)
CREATININE: 1.23 mg/dL (ref 0.61–1.24)
Chloride: 101 mmol/L (ref 101–111)
Creatinine, Ser: 1.32 mg/dL — ABNORMAL HIGH (ref 0.61–1.24)
GFR calc Af Amer: >60 mL/min (ref >60)
GFR calc non Af Amer: >60 mL/min (ref >60)
GLUCOSE: 87 mg/dL (ref 65–99)
GLUCOSE: 90 mg/dL (ref 65–99)
POTASSIUM: 5.3 mmol/L — AB (ref 3.5–5.1)
Potassium: 3.5 mmol/L (ref 3.5–5.1)
SODIUM: 136 mmol/L (ref 135–145)
Sodium: 137 mmol/L (ref 135–145)

## 2015-08-08 LAB — CBC
HCT: 40.5 % (ref 39.0–52.0)
Hemoglobin: 14.2 g/dL (ref 13.0–17.0)
MCH: 22.6 pg — AB (ref 26.0–34.0)
MCHC: 35.1 g/dL (ref 30.0–36.0)
MCV: 64.4 fL — ABNORMAL LOW (ref 78.0–100.0)
PLATELETS: 237 10*3/uL (ref 150–400)
RBC: 6.29 MIL/uL — ABNORMAL HIGH (ref 4.22–5.81)
RDW: 16 % — AB (ref 11.5–15.5)
WBC: 8.5 10*3/uL (ref 4.0–10.5)

## 2015-08-08 LAB — I-STAT TROPONIN, ED
TROPONIN I, POC: 0 ng/mL (ref 0.00–0.08)
TROPONIN I, POC: 0 ng/mL (ref 0.00–0.08)

## 2015-08-08 LAB — RAPID URINE DRUG SCREEN, HOSP PERFORMED
Amphetamines: NOT DETECTED
Barbiturates: NOT DETECTED
Benzodiazepines: NOT DETECTED
Cocaine: NOT DETECTED
Opiates: NOT DETECTED
Tetrahydrocannabinol: NOT DETECTED

## 2015-08-08 NOTE — ED Notes (Signed)
PT is here with chest pain and states he has enlarged heart and heart murmur.  Pt states chest pain for a couple of weeks and radiates to left face

## 2015-08-08 NOTE — ED Provider Notes (Signed)
CSN: [id number]     Arrival date & time 08/08/15  1730 History   First MD Initiated Contact with Patient 08/08/15 2025     Chief Complaint  Patient presents with  . Chest Pain     (Consider location/radiation/quality/duration/timing/severity/associated sxs/prior Treatment) HPI Alan Foley is a 19 y.o. male presents to emergency department complaining of chest pain. Patient states he has been having chest pain for "many many years." He states at one point in time he was told that he had a murmur and that he had an enlarged heart. Patient states that he has been under a lot of stress recently. He states that every time he gets stressed out he starts having pains in his chest. He denies any dizziness or lightheadedness. Denies any shortness of breath. Denies any diaphoresis. Patient states he was stressed out today started having chest pain so he decided to come get it checked out. He is currently asymptomatic. Patient denies any extremity swelling. No recent travel or surgeries. Denies any supplements or medications.  Past Medical History  Diagnosis Date  . Enlarged heart    History reviewed. No pertinent past surgical history. No family history on file. Social History  Substance Use Topics  . Smoking status: Never Smoker   . Smokeless tobacco: None  . Alcohol Use: No    Review of Systems  Constitutional: Negative for fever and chills.  Respiratory: Positive for chest tightness. Negative for cough and shortness of breath.   Cardiovascular: Positive for chest pain. Negative for palpitations and leg swelling.  Gastrointestinal: Negative for nausea, vomiting, abdominal pain, diarrhea and abdominal distention.  Musculoskeletal: Negative for myalgias, arthralgias, neck pain and neck stiffness.  Skin: Negative for rash.  Allergic/Immunologic: Negative for immunocompromised state.  Neurological: Negative for dizziness, weakness, light-headedness, numbness and headaches.  All other  systems reviewed and are negative.     Allergies  Review of patient's allergies indicates no known allergies.  Home Medications   Prior to Admission medications   Not on File   BP 147/77 mmHg  Pulse 89  Temp(Src) 98.8 F (37.1 C) (Oral)  Resp 22  SpO2 97% Physical Exam  Constitutional: He is oriented to person, place, and time. He appears well-developed and well-nourished. No distress.  HENT:  Head: Normocephalic and atraumatic.  Eyes: Conjunctivae are normal.  Neck: Neck supple.  Cardiovascular: Normal rate, regular rhythm and normal heart sounds.   Pulmonary/Chest: Effort normal and breath sounds normal. No respiratory distress. He has no wheezes. He has no rales.  Abdominal: Soft. Bowel sounds are normal. He exhibits no distension. There is no tenderness. There is no rebound.  Musculoskeletal: He exhibits no edema.  Neurological: He is alert and oriented to person, place, and time.  Skin: Skin is warm and dry.  Nursing note and vitals reviewed.   ED Course  Procedures (including critical care time) Labs Review Labs Reviewed  BASIC METABOLIC PANEL - Abnormal; Notable for the following:    Potassium 5.3 (*)    Creatinine, Ser 1.32 (*)    All other components within normal limits  CBC - Abnormal; Notable for the following:    RBC 6.29 (*)    MCV 64.4 (*)    MCH 22.6 (*)    RDW 16.0 (*)    All other components within normal limits  BASIC METABOLIC PANEL - Abnormal; Notable for the following:    Calcium 8.8 (*)    All other components within normal limits  URINE RAPID DRUG SCREEN,  HOSP PERFORMED  I-STAT TROPOININ, ED  I-STAT TROPOININ, ED    Imaging Review Dg Chest 2 View  08/08/2015  CLINICAL DATA:  Acute onset of left-sided chest pain, radiating to the face and jaw. Shortness of breath and palpitations. Initial encounter. EXAM: CHEST  2 VIEW COMPARISON:  Chest radiograph performed 02/27/2014 FINDINGS: The lungs are well-aerated and clear. There is no  evidence of focal opacification, pleural effusion or pneumothorax. The heart is normal in size; the mediastinal contour is within normal limits. No acute osseous abnormalities are seen. IMPRESSION: No acute cardiopulmonary process seen. Electronically Signed   By: Roanna Raider M.D.   On: 08/08/2015 18:14   I have personally reviewed and evaluated these images and lab results as part of my medical decision-making.   EKG Interpretation None      MDM   Final diagnoses:  Chest pain, unspecified chest pain type    patient with nonspecific chest pain for "multiple years." He appears to be very anxious and states that his pain is worse when he is stressed out. He is otherwise healthy 19 year old, he has no family history of cardiac disease, he denies smoking, he does not take any drugs or supplements. He has no risk factors for coronary disease. Check labs and basic electrolytes, chest x-ray. No risk factors for PE. VS normal.   Labs and x-rays negative. We'll discharge home with close outpatient follow-up.  Filed Vitals:   08/08/15 2004 08/08/15 2015 08/08/15 2030 08/08/15 2045  BP: 144/70 136/62 136/88 147/77  Pulse: 92 73 101 89  Temp:      TempSrc:      Resp: SpO2: 97% 97% 99% 97%     Jaynie Crumble, PA-C 08/08/15 2332  Azalia Bilis, MD 08/09/15 [phone]

## 2015-08-08 NOTE — ED Notes (Signed)
Placed pt in a gown and hooked up to the monitor with a 5 lead, BP cuff and pulse ox 

## 2015-08-08 NOTE — Discharge Instructions (Signed)
Please follow with primary care doctor for recheck and further testing if chest pains continue. Return if worsening symptoms  Nonspecific Chest Pain  Chest pain can be caused by many different conditions. There is always a chance that your pain could be related to something serious, such as a heart attack or a blood clot in your lungs. Chest pain can also be caused by conditions that are not life-threatening. If you have chest pain, it is very important to follow up with your health care provider. CAUSES  Chest pain can be caused by:  Heartburn.  Pneumonia or bronchitis.  Anxiety or stress.  Inflammation around your heart (pericarditis) or lung (pleuritis or pleurisy).  A blood clot in your lung.  A collapsed lung (pneumothorax). It can develop suddenly on its own (spontaneous pneumothorax) or from trauma to the chest.  Shingles infection (varicella-zoster virus).  Heart attack.  Damage to the bones, muscles, and cartilage that make up your chest wall. This can include:  Bruised bones due to injury.  Strained muscles or cartilage due to frequent or repeated coughing or overwork.  Fracture to one or more ribs.  Sore cartilage due to inflammation (costochondritis). RISK FACTORS  Risk factors for chest pain may include:  Activities that increase your risk for trauma or injury to your chest.  Respiratory infections or conditions that cause frequent coughing.  Medical conditions or overeating that can cause heartburn.  Heart disease or family history of heart disease.  Conditions or health behaviors that increase your risk of developing a blood clot.  Having had chicken pox (varicella zoster). SIGNS AND SYMPTOMS Chest pain can feel like:  Burning or tingling on the surface of your chest or deep in your chest.  Crushing, pressure, aching, or squeezing pain.  Dull or sharp pain that is worse when you move, cough, or take a deep breath.  Pain that is also felt in your  back, neck, shoulder, or arm, or pain that spreads to any of these areas. Your chest pain may come and go, or it may stay constant. DIAGNOSIS Lab tests or other studies may be needed to find the cause of your pain. Your health care provider may have you take a test called an ambulatory ECG (electrocardiogram). An ECG records your heartbeat patterns at the time the test is performed. You may also have other tests, such as:  Transthoracic echocardiogram (TTE). During echocardiography, sound waves are used to create a picture of all of the heart structures and to look at how blood flows through your heart.  Transesophageal echocardiogram (TEE).This is a more advanced imaging test that obtains images from inside your body. It allows your health care provider to see your heart in finer detail.  Cardiac monitoring. This allows your health care provider to monitor your heart rate and rhythm in real time.  Holter monitor. This is a portable device that records your heartbeat and can help to diagnose abnormal heartbeats. It allows your health care provider to track your heart activity for several days, if needed.  Stress tests. These can be done through exercise or by taking medicine that makes your heart beat more quickly.  Blood tests.  Imaging tests. TREATMENT  Your treatment depends on what is causing your chest pain. Treatment may include:  Medicines. These may include:  Acid blockers for heartburn.  Anti-inflammatory medicine.  Pain medicine for inflammatory conditions.  Antibiotic medicine, if an infection is present.  Medicines to dissolve blood clots.  Medicines to treat coronary  artery disease.  Supportive care for conditions that do not require medicines. This may include:  Resting.  Applying heat or cold packs to injured areas.  Limiting activities until pain decreases. HOME CARE INSTRUCTIONS  If you were prescribed an antibiotic medicine, finish it all even if you  start to feel better.  Avoid any activities that bring on chest pain.  Do not use any tobacco products, including cigarettes, chewing tobacco, or electronic cigarettes. If you need help quitting, ask your health care provider.  Do not drink alcohol.  Take medicines only as directed by your health care provider.  Keep all follow-up visits as directed by your health care provider. This is important. This includes any further testing if your chest pain does not go away.  If heartburn is the cause for your chest pain, you may be told to keep your head raised (elevated) while sleeping. This reduces the chance that acid will go from your stomach into your esophagus.  Make lifestyle changes as directed by your health care provider. These may include:  Getting regular exercise. Ask your health care provider to suggest some activities that are safe for you.  Eating a heart-healthy diet. A registered dietitian can help you to learn healthy eating options.  Maintaining a healthy weight.  Managing diabetes, if necessary.  Reducing stress. SEEK MEDICAL CARE IF:  Your chest pain does not go away after treatment.  You have a rash with blisters on your chest.  You have a fever. SEEK IMMEDIATE MEDICAL CARE IF:   Your chest pain is worse.  You have an increasing cough, or you cough up blood.  You have severe abdominal pain.  You have severe weakness.  You faint.  You have chills.  You have sudden, unexplained chest discomfort.  You have sudden, unexplained discomfort in your arms, back, neck, or jaw.  You have shortness of breath at any time.  You suddenly start to sweat, or your skin gets clammy.  You feel nauseous or you vomit.  You suddenly feel light-headed or dizzy.  Your heart begins to beat quickly, or it feels like it is skipping beats. These symptoms may represent a serious problem that is an emergency. Do not wait to see if the symptoms will go away. Get medical  help right away. Call your local emergency services (911 in the [place] Do not drive yourself to the hospital.   This information is not intended to replace advice given to you by your health care provider. Make sure you discuss any questions you have with your health care provider.   Document Released: 07/05/2005 Document Revised: 10/16/2014 Document Reviewed: 05/01/2014 Elsevier Interactive Patient Education [employer].

## 2015-08-08 NOTE — ED Notes (Signed)
Reviewed discharge instructions with patient, patient verbalized understanding. VSS.

## 2015-09-20 ENCOUNTER — Emergency Department (HOSPITAL_COMMUNITY): Payer: Self-pay

## 2015-09-20 ENCOUNTER — Encounter (HOSPITAL_COMMUNITY): Payer: Self-pay | Admitting: *Deleted

## 2015-09-20 ENCOUNTER — Emergency Department (HOSPITAL_COMMUNITY)
Admission: EM | Admit: 2015-09-20 | Discharge: 2015-09-21 | Disposition: A | Discharge status: Home / Self Care | Payer: Self-pay | Attending: Emergency Medicine | Admitting: Emergency Medicine

## 2015-09-20 DIAGNOSIS — R112 Nausea with vomiting, unspecified: Secondary | ICD-10-CM | POA: Insufficient documentation

## 2015-09-20 DIAGNOSIS — R0602 Shortness of breath: Secondary | ICD-10-CM

## 2015-09-20 DIAGNOSIS — B349 Viral infection, unspecified: Secondary | ICD-10-CM | POA: Insufficient documentation

## 2015-09-20 DIAGNOSIS — Z8679 Personal history of other diseases of the circulatory system: Secondary | ICD-10-CM | POA: Insufficient documentation

## 2015-09-20 DIAGNOSIS — J45901 Unspecified asthma with (acute) exacerbation: Secondary | ICD-10-CM | POA: Insufficient documentation

## 2015-09-20 HISTORY — DX: Unspecified asthma, uncomplicated: J45.909

## 2015-09-20 LAB — BASIC METABOLIC PANEL
Anion gap: 11 (ref 5–15)
BUN: 10 mg/dL (ref 6–20)
CO2: 21 mmol/L — AB (ref 22–32)
Calcium: 9.5 mg/dL (ref 8.9–10.3)
Chloride: 105 mmol/L (ref 101–111)
Creatinine, Ser: 1.36 mg/dL — ABNORMAL HIGH (ref 0.61–1.24)
GFR calc Af Amer: >60 mL/min (ref >60)
GLUCOSE: 107 mg/dL — AB (ref 65–99)
POTASSIUM: 3.8 mmol/L (ref 3.5–5.1)
Sodium: 137 mmol/L (ref 135–145)

## 2015-09-20 LAB — CBC WITH DIFFERENTIAL/PLATELET
BASOS PCT: 0 %
Basophils Absolute: 0 10*3/uL (ref 0.0–0.1)
EOS ABS: 0.1 10*3/uL (ref 0.0–0.7)
EOS PCT: 1 %
HEMATOCRIT: 40.6 % (ref 39.0–52.0)
Hemoglobin: 14.7 g/dL (ref 13.0–17.0)
Lymphocytes Relative: 8 %
Lymphs Abs: 1.1 10*3/uL (ref 0.7–4.0)
MCH: 22.8 pg — AB (ref 26.0–34.0)
MCHC: 36.2 g/dL — ABNORMAL HIGH (ref 30.0–36.0)
MCV: 63 fL — AB (ref 78.0–100.0)
MONO ABS: 0.9 10*3/uL (ref 0.1–1.0)
Monocytes Relative: 7 %
NEUTROS ABS: 11.2 10*3/uL — AB (ref 1.7–7.7)
Neutrophils Relative %: 84 %
PLATELETS: 233 10*3/uL (ref 150–400)
RBC: 6.44 MIL/uL — ABNORMAL HIGH (ref 4.22–5.81)
RDW: 15.9 % — ABNORMAL HIGH (ref 11.5–15.5)
WBC: 13.3 10*3/uL — ABNORMAL HIGH (ref 4.0–10.5)

## 2015-09-20 LAB — I-STAT TROPONIN, ED: Troponin i, poc: 0 ng/mL (ref 0.00–0.08)

## 2015-09-20 MED ORDER — ONDANSETRON 4 MG PO TBDP: 4.0000 mg | ORAL_TABLET | Freq: Three times a day (TID) | ORAL | Status: DC | PRN

## 2015-09-20 MED ORDER — ALBUTEROL SULFATE HFA 108 (90 BASE) MCG/ACT IN AERS: 1.0000 | INHALATION_SPRAY | Freq: Four times a day (QID) | RESPIRATORY_TRACT | Status: DC | PRN

## 2015-09-20 NOTE — Discharge Instructions (Signed)
As we discussed, your symptoms are likely due to a virus, possibly Mono. Your labs also suggest you might be a little bit dehydrated. Otherwise your labs and chest x-ray were unremarkable. Please follow-up with your primary care provider within the next week for further evaluation and management of your symptoms. In the meantime I will give you a prescription for Zofran to take as needed for nausea and an inhaler to use as needed for shortness of breath. Return to the ER for new or worsening symptoms.

## 2015-09-20 NOTE — ED Notes (Signed)
Patient states "a few hours ago"  he became SOB.  States has had some chest pain  States that SOB started at work

## 2015-09-20 NOTE — ED Provider Notes (Signed)
CSN: [id number]     Arrival date & time 09/20/15  1932 History   None    Chief Complaint  Patient presents with  . Shortness of Breath     HPI  Mr. Delford FieldWright is an 19 y.o. male with distant history of asthma who presents to the ED for evaluation of shortness of breath and feeling "just out of it." He states he has been feeling fatigued for "a while" but then this morning he woke up feeling nauseated. Endorses a couple episodes of watery diarrhea this AM. Pt reports he then began feeling muscle aches all over. States he then felt like he was breathing really fast and could not get a good breath of air so he came to the ED. While in triage pt was reportedly hyperventilating but maintaining SpO2. He also had one episode of emesis in the waiting room. In the ED now pt reports he feels completely fine and back to baseline. He would like to go home. He denies any current nausea, SOB, chest pain. Does continue to endorse mild myalgias.   Past Medical History  Diagnosis Date  . Enlarged heart   . Asthma    History reviewed. No pertinent past surgical history. No family history on file. Social History  Substance Use Topics  . Smoking status: Never Smoker   . Smokeless tobacco: Never Used  . Alcohol Use: No    Review of Systems  All other systems reviewed and are negative.     Allergies  Review of patient's allergies indicates no known allergies.  Home Medications   Prior to Admission medications   Not on File   BP 126/72 mmHg  Pulse 92  Temp(Src) 98.3 F (36.8 C) (Oral)  Resp 24  Ht 5\' 9"  (1.753 m)  Wt 108.863 kg  BMI 35.43 kg/m2  SpO2 100% Physical Exam  Constitutional: He is oriented to person, place, and time. No distress.  HENT:  Right Ear: External ear normal.  Left Ear: External ear normal.  Nose: Nose normal.  Mouth/Throat: Oropharynx is clear and moist. No oropharyngeal exudate.  Eyes: Conjunctivae and EOM are normal. Pupils are equal, round, and reactive to  light.  Neck: Normal range of motion. Neck supple.  Cardiovascular: Normal rate, regular rhythm, normal heart sounds and intact distal pulses.   Pulmonary/Chest: Effort normal and breath sounds normal. No respiratory distress. He has no wheezes. He exhibits no tenderness.  Abdominal: Soft. Bowel sounds are normal. He exhibits no distension. There is no tenderness.  Musculoskeletal: Normal range of motion. He exhibits no edema or tenderness.  Neurological: He is alert and oriented to person, place, and time. No cranial nerve deficit.  Skin: Skin is warm and dry. He is not diaphoretic.  Psychiatric: He has a normal mood and affect.  Nursing note and vitals reviewed.   ED Course  Procedures (including critical care time) Labs Review Labs Reviewed  CBC WITH DIFFERENTIAL/PLATELET - Abnormal; Notable for the following:    WBC 13.3 (*)    RBC 6.44 (*)    MCV 63.0 (*)    MCH 22.8 (*)    MCHC 36.2 (*)    RDW 15.9 (*)    Neutro Abs 11.2 (*)    All other components within normal limits  BASIC METABOLIC PANEL - Abnormal; Notable for the following:    CO2 21 (*)    Glucose, Bld 107 (*)    Creatinine, Ser 1.36 (*)    All other components within normal limits  Rosezena Sensor, ED    Imaging Review Dg Chest 2 View  09/20/2015  CLINICAL DATA:  Shortness of breath a few hours ago. EXAM: CHEST  2 VIEW COMPARISON:  08/08/2015 FINDINGS: Two views of the chest were obtained. Low lung volumes without focal disease. Heart size is within normal limits. The trachea is midline. No pleural effusions. IMPRESSION: Low lung volumes without focal disease. Electronically Signed   By: Richarda Overlie M.D.   On: 09/20/2015 20:40   I have personally reviewed and evaluated these images and lab results as part of my medical decision-making.   EKG Interpretation None      MDM   Final diagnoses:  Viral syndrome  Non-intractable vomiting with nausea, vomiting of unspecified type  Shortness of breath    Pt is  a very well appearing 19 y.o. male with history of childhood asthma. He is completely well appearing now in the ED with no complaints other than mild myalgias. He is afebrile and not tachycardic or tachypneic. Initial labs were drawn in triage and show slight elevated white count of 13.3 with increased atypical lymphocytes which is consistent with viral syndrome, perhaps mono. Regarding pt's reported SOB earlier, he is breathing comfortably now and has SpO2 99-100% on room air. His lungs sound clear with no wheezes, and with good air movement, though CXR in triage shows low lung volumes. I will give rx for albuterol inhaler as pt reports he does not have a rescue inhaler at home. Will also give rx for zofran. Otherwise pt is nontoxic, VSS, and stable for discharge. Instructed to f/u with pcp. Increase hydration given Cr of 1.36 though this is near pt's baseline.  ER return precautions given.   [name], PA-C 09/21/15 5621  Eber Hong, MD 09/29/15 [phone]

## 2015-12-21 ENCOUNTER — Encounter (HOSPITAL_COMMUNITY): Payer: Self-pay

## 2015-12-21 ENCOUNTER — Emergency Department (HOSPITAL_COMMUNITY)
Admission: EM | Admit: 2015-12-21 | Discharge: 2015-12-21 | Disposition: A | Discharge status: Home / Self Care | Payer: Self-pay | Attending: Emergency Medicine | Admitting: Emergency Medicine

## 2015-12-21 DIAGNOSIS — R109 Unspecified abdominal pain: Secondary | ICD-10-CM | POA: Insufficient documentation

## 2015-12-21 DIAGNOSIS — J45909 Unspecified asthma, uncomplicated: Secondary | ICD-10-CM | POA: Insufficient documentation

## 2015-12-21 DIAGNOSIS — R197 Diarrhea, unspecified: Secondary | ICD-10-CM | POA: Insufficient documentation

## 2015-12-21 DIAGNOSIS — R1110 Vomiting, unspecified: Secondary | ICD-10-CM | POA: Insufficient documentation

## 2015-12-21 LAB — URINALYSIS, ROUTINE W REFLEX MICROSCOPIC
GLUCOSE, UA: NEGATIVE mg/dL
HGB URINE DIPSTICK: NEGATIVE
KETONES UR: 15 mg/dL — AB
LEUKOCYTES UA: NEGATIVE
Nitrite: NEGATIVE
PROTEIN: NEGATIVE mg/dL
Specific Gravity, Urine: 1.028 (ref 1.005–1.030)
pH: 6 (ref 5.0–8.0)

## 2015-12-21 LAB — COMPREHENSIVE METABOLIC PANEL
ALBUMIN: 3.9 g/dL (ref 3.5–5.0)
ALK PHOS: 61 U/L (ref 38–126)
ALT: 34 U/L (ref 17–63)
ANION GAP: 9 (ref 5–15)
AST: 26 U/L (ref 15–41)
BILIRUBIN TOTAL: 1.1 mg/dL (ref 0.3–1.2)
BUN: 9 mg/dL (ref 6–20)
CALCIUM: 8.9 mg/dL (ref 8.9–10.3)
CO2: 24 mmol/L (ref 22–32)
Chloride: 111 mmol/L (ref 101–111)
Creatinine, Ser: 1.19 mg/dL (ref 0.61–1.24)
GFR calc Af Amer: >60 mL/min (ref >60)
GFR calc non Af Amer: >60 mL/min (ref >60)
GLUCOSE: 92 mg/dL (ref 65–99)
Potassium: 3.7 mmol/L (ref 3.5–5.1)
SODIUM: 144 mmol/L (ref 135–145)
TOTAL PROTEIN: 6.8 g/dL (ref 6.5–8.1)

## 2015-12-21 LAB — CBC
HCT: 39.2 % (ref 39.0–52.0)
HEMOGLOBIN: 14.2 g/dL (ref 13.0–17.0)
MCH: 22.6 pg — AB (ref 26.0–34.0)
MCHC: 36.2 g/dL — ABNORMAL HIGH (ref 30.0–36.0)
MCV: 62.5 fL — AB (ref 78.0–100.0)
PLATELETS: 188 10*3/uL (ref 150–400)
RBC: 6.27 MIL/uL — ABNORMAL HIGH (ref 4.22–5.81)
RDW: 15.4 % (ref 11.5–15.5)
WBC: 5.6 10*3/uL (ref 4.0–10.5)

## 2015-12-21 LAB — LIPASE, BLOOD: Lipase: 30 U/L (ref 11–51)

## 2015-12-21 NOTE — ED Notes (Signed)
Pt here with c/o abdominal pain, N/V/D onset Sunday. He reports his diarrhea is clear liquid and unable to hold any fluids down. He reports his boss made him come to ER to be evaluated.

## 2015-12-21 NOTE — ED Notes (Signed)
Called no answer

## 2015-12-21 NOTE — ED Notes (Addendum)
Called pt for [facility] Track but no answer in waiting room

## 2016-06-22 ENCOUNTER — Emergency Department (HOSPITAL_COMMUNITY)
Admission: EM | Admit: 2016-06-22 | Discharge: 2016-06-22 | Disposition: A | Discharge status: Home / Self Care | Payer: Self-pay | Attending: Emergency Medicine | Admitting: Emergency Medicine

## 2016-06-22 ENCOUNTER — Encounter (HOSPITAL_COMMUNITY): Payer: Self-pay | Admitting: Emergency Medicine

## 2016-06-22 ENCOUNTER — Emergency Department (HOSPITAL_COMMUNITY): Payer: Self-pay

## 2016-06-22 DIAGNOSIS — Y9389 Activity, other specified: Secondary | ICD-10-CM | POA: Insufficient documentation

## 2016-06-22 DIAGNOSIS — W268XXA Contact with other sharp object(s), not elsewhere classified, initial encounter: Secondary | ICD-10-CM | POA: Insufficient documentation

## 2016-06-22 DIAGNOSIS — Y990 Civilian activity done for income or pay: Secondary | ICD-10-CM | POA: Insufficient documentation

## 2016-06-22 DIAGNOSIS — S61219A Laceration without foreign body of unspecified finger without damage to nail, initial encounter: Secondary | ICD-10-CM

## 2016-06-22 DIAGNOSIS — Z79899 Other long term (current) drug therapy: Secondary | ICD-10-CM | POA: Insufficient documentation

## 2016-06-22 DIAGNOSIS — IMO0002 Reserved for concepts with insufficient information to code with codable children: Secondary | ICD-10-CM

## 2016-06-22 DIAGNOSIS — Y9289 Other specified places as the place of occurrence of the external cause: Secondary | ICD-10-CM | POA: Insufficient documentation

## 2016-06-22 DIAGNOSIS — J45909 Unspecified asthma, uncomplicated: Secondary | ICD-10-CM | POA: Insufficient documentation

## 2016-06-22 DIAGNOSIS — S61213A Laceration without foreign body of left middle finger without damage to nail, initial encounter: Secondary | ICD-10-CM | POA: Insufficient documentation

## 2016-06-22 DIAGNOSIS — F1290 Cannabis use, unspecified, uncomplicated: Secondary | ICD-10-CM | POA: Insufficient documentation

## 2016-06-22 MED ORDER — LIDOCAINE HCL 1 % IJ SOLN
20.0000 mL | Freq: Once | INTRAMUSCULAR | Status: AC
  Administered 2016-06-22: 20 mL

## 2016-06-22 MED ORDER — LIDOCAINE HCL 2 % IJ SOLN
INTRAMUSCULAR | Status: AC
  Administered 2016-06-22: 19:00:00
  Filled 2016-06-22: qty 20

## 2016-06-22 MED ORDER — TETANUS-DIPHTH-ACELL PERTUSSIS 5-2.5-18.5 LF-MCG/0.5 IM SUSP
0.5000 mL | Freq: Once | INTRAMUSCULAR | Status: DC
  Filled 2016-06-22: qty 0.5

## 2016-06-22 MED ORDER — LIDOCAINE HCL (PF) 1 % IJ SOLN: 30.0000 mL | Freq: Once | INTRAMUSCULAR | Status: DC

## 2016-06-22 MED ORDER — CEPHALEXIN 500 MG PO CAPS: 500.0000 mg | ORAL_CAPSULE | Freq: Four times a day (QID) | ORAL | 0 refills | Status: DC

## 2016-06-22 NOTE — Progress Notes (Addendum)
Left middle finger knuckle with a laceration. Minimal bleeding. Pt does have ROM. He states he was in a junk yard and cut his hand on a part, pt s/p x-ray of his finger. Tetanus shot  Given in left deltoid. Tolerated well.Finger dressed and bactracin applied. Pt also given some supplies to take home. Awaitng ortho tech to splint pts finger.

## 2016-06-22 NOTE — ED Provider Notes (Signed)
[facility]-EMERGENCY DEPT Provider Note   CSN: [id number] Arrival date & time: 06/22/16  1430   By signing my name below, I, Alan Foley, attest that this documentation has been prepared under the direction and in the presence of Alan Foley L. [name], PA-C. Electronically Signed: Christel Foley, Scribe. 06/22/2016. 5:06 PM.   History   Chief Complaint Chief Complaint  Patient presents with  . Laceration    The history is provided by the patient. No language interpreter was used.   HPI Comments:  Alan Foley is a 20 y.o. male who presents to the Emergency Department complaining of a laceration to his L middle finger that he sustained at 11AM today. 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 Pt has controlled the bleeding. Tetanus is not UTD. Pt denies loss of sensation or decreased ROM.   Past Medical History:  Diagnosis Date  . Asthma   . Enlarged heart     There are no active problems to display for this patient.   History reviewed. No pertinent surgical history.     Home Medications    Prior to Admission medications   Medication Sig Start Date End Date Taking? Authorizing Provider  albuterol (PROVENTIL HFA;VENTOLIN HFA) 108 (90 BASE) MCG/ACT inhaler Inhale 1-2 puffs into the lungs every 6 (six) hours as needed for wheezing or shortness of breath. 09/20/15   Ace Gins Sam, PA-C  cephALEXin (KEFLEX) 500 MG capsule Take 1 capsule (500 mg total) by mouth 4 (four) times daily. 06/22/16   [name], PA  ondansetron (ZOFRAN ODT) 4 MG disintegrating tablet Take 1 tablet (4 mg total) by mouth every 8 (eight) hours as needed for nausea or vomiting. 09/20/15   [name], PA-C    Family History No family history on file.  Social History Social History  Substance Use Topics  . Smoking status: Never Smoker  . Smokeless tobacco: Never Used  . Alcohol use No     Allergies   Review of patient's allergies indicates  no known allergies.   Review of Systems Review of Systems  Constitutional: Negative for fever.  Gastrointestinal: Negative for nausea and vomiting.  Skin: Positive for wound.  Neurological: Negative for numbness.     Physical Exam Updated Vital Signs BP 124/92 (BP Location: Right Arm)   Pulse 79   Temp 98.2 F (36.8 C) (Oral)   Resp 18   Ht 5\' 9"  (1.753 m)   Wt 240 lb (108.9 kg)   SpO2 99%   BMI 35.44 kg/m   Physical Exam  Constitutional: He appears well-developed and well-nourished. No distress.  HENT:  Head: Normocephalic and atraumatic.  Eyes: Conjunctivae are normal.  Cardiovascular: Intact distal pulses.   Pulmonary/Chest: Effort normal. No respiratory distress.  Musculoskeletal: Normal range of motion.  Neurological: He is alert. Coordination normal.  Skin: Skin is warm and dry. He is not diaphoretic.  Roughly 4-5 cm laceration noted to left middle finger over the PIP joint. Patient has full range of motion and strength is 5/5 of resisted flexion and extension of the finger. Does not appear to be any tendon or nerve involvement. Brisk capillary refill. 2+ radial pulses bilaterally. Sensation intact. Patient is neurovascularly intact distally.  Psychiatric: He has a normal mood and affect. His behavior is normal.  Nursing note and vitals reviewed.    ED Treatments / Results  DIAGNOSTIC STUDIES:  Oxygen Saturation is 99% on RA, normal  by my interpretation.    COORDINATION OF CARE:  5:06 PM Discussed treatment plan with pt at bedside and pt agreed to plan.   Labs (all labs ordered are listed, but only abnormal results are displayed) Labs Reviewed - No data to display  EKG  EKG Interpretation None       Radiology Dg Finger Middle Left  Result Date: 06/22/2016 CLINICAL DATA:  Trauma to left middle finger. Laceration and bleeding. EXAM: LEFT MIDDLE FINGER 2+V COMPARISON:  None FINDINGS: Soft tissue swelling is present over the PIP joint of the middle  finger. There is no underlying fracture. The joint is located. No radiopaque foreign body is present. IMPRESSION: Soft swelling over the dorsum of the PIP joint without underlying fracture. Electronically Signed   By: [name] M.D.   On: 06/22/2016 18:25    Procedures .Marland KitchenLaceration Repair Date/Time: 06/22/2016 7:29 PM Performed by: Rhona Raider, Mauri Temkin L Authorized by: Mattie Marlin L   Consent:    Consent obtained:  Verbal   Consent given by:  Patient   Risks discussed:  Infection, pain, poor cosmetic result and nerve damage   Alternatives discussed:  No treatment Anesthesia (see MAR for exact dosages):    Anesthesia method:  Local infiltration   Local anesthetic:  Lidocaine 2% w/o epi Laceration details:    Location:  Finger   Finger location:  L long finger   Length (cm):  4 Repair type:    Repair type:  Simple Pre-procedure details:    Preparation:  Patient was prepped and draped in usual sterile fashion Exploration:    Wound exploration: wound explored through full range of motion and entire depth of wound probed and visualized     Contaminated: no   Treatment:    Area cleansed with:  Betadine   Amount of cleaning:  Standard   Irrigation solution:  Sterile saline   Irrigation method:  Pressure wash Skin repair:    Repair method:  Sutures   Suture size:  4-0   Suture material:  Prolene   Number of sutures:  8 Approximation:    Approximation:  Close   Vermilion border: well-aligned   Post-procedure details:    Dressing:  Antibiotic ointment and sterile dressing   Patient tolerance of procedure:  Tolerated well, no immediate complications   (including critical care time)  Medications Ordered in ED Medications  Tdap (BOOSTRIX) injection 0.5 mL (not administered)  lidocaine (XYLOCAINE) 1 % (with pres) injection 20 mL (20 mLs Infiltration Given 06/22/16 1839)  lidocaine (XYLOCAINE) 2 % (with pres) injection (  Given 06/22/16 1839)     Initial Impression /  Assessment and Plan / ED Course  I have reviewed the triage vital signs and the nursing notes.  Pertinent labs & imaging results that were available during my care of the patient were reviewed by me and considered in my medical decision making (see chart for details).  Clinical Course   Tdap booster given.Pressure irrigation performed. Laceration occurred < 8 hours prior to repair which was well tolerated. Pt has no co morbidities to effect normal wound healing. D/c Patient with Keflex because laceration is over a joint although does not appear to involve joint. X-rays of finger revealed no bony abnormalities or foreign bodies. Patient neurovascular intact distally. Finger splint was placed per request of the patient. Discussed suture home care w pt and answered questions. Patient to follow-up with primary care provider in 3 days for wound check. Pt to f-u for suture removal in  10-14 days. Discussed strict return precautions to include signs of infection. Pt is hemodynamically stable w no complaints prior to dc. Patient voices understanding to the discharge instructions.   I personally performed the services described in this documentation, which was scribed in my presence. The recorded information has been reviewed and is accurate.   Final Clinical Impressions(s) / ED Diagnoses   Final diagnoses:  Laceration of finger, initial encounter    New Prescriptions Discharge Medication List as of 06/22/2016  7:27 PM       Jerre SimonJessica L Prerna Harold, PA 06/22/16 2117    Jacalyn LefevreJulie Haviland, MD 06/22/16 [phone]

## 2016-06-22 NOTE — ED Notes (Signed)
Ortho paged to apply splint. 

## 2016-06-22 NOTE — ED Triage Notes (Signed)
Pt reports cutting his left middle finger on a wheel rotor at work. Laceration noted to finger. Bleeding controlled at this time.

## 2016-06-22 NOTE — ED Notes (Signed)
Per Marylu LundJanet, RN, pt already given Tdap shot and entered information into chart concerning injection.

## 2016-06-22 NOTE — Discharge Instructions (Signed)
Follow-up with your primary care provider in 3 days to have your wound rechecked for signs of infection. Return to the emergency department in 10-14 days to have your sutures removed. Keep wound clean and dry and change your bandage daily. Return immediately to the emergency department if you experience as infection to include swelling, pain, redness, foul discharge, increased warmth, red streaks, numbness of the tip of your finger, fever any other concerning symptoms.

## 2016-07-02 ENCOUNTER — Emergency Department (HOSPITAL_COMMUNITY)
Admission: EM | Admit: 2016-07-02 | Discharge: 2016-07-02 | Disposition: A | Discharge status: Home / Self Care | Payer: Self-pay | Attending: Emergency Medicine | Admitting: Emergency Medicine

## 2016-07-02 ENCOUNTER — Encounter (HOSPITAL_COMMUNITY): Payer: Self-pay | Admitting: Emergency Medicine

## 2016-07-02 DIAGNOSIS — J45909 Unspecified asthma, uncomplicated: Secondary | ICD-10-CM | POA: Insufficient documentation

## 2016-07-02 DIAGNOSIS — Z4802 Encounter for removal of sutures: Secondary | ICD-10-CM | POA: Insufficient documentation

## 2016-07-02 DIAGNOSIS — Z79899 Other long term (current) drug therapy: Secondary | ICD-10-CM | POA: Insufficient documentation

## 2016-07-02 NOTE — Discharge Instructions (Signed)
Read the information below.  Your sutures were removed. Antibiotic ointment and a bandage were applied. Keep area clean and dry. You can wash with warm soap and water and apply antibiotic ointment.  Follow up with your primary doctor as needed.  You may return to the Emergency Department at any time for worsening condition or any new symptoms that concern you. Return if you develop fever, redness, warmth, swelling, purulent discharge, or streaking to the area.

## 2016-07-02 NOTE — ED Provider Notes (Signed)
[facility]-EMERGENCY DEPT Provider Note   CSN: [id number] Arrival date & time: 07/02/16  1719  By signing my name below, I, Christy SartoriusAnastasia Kolousek, attest that this documentation has been prepared under the direction and in the presence of  Arvilla MeresAshley Meyer, PA-C. Electronically Signed: Christy SartoriusAnastasia Kolousek, ED Scribe. 07/02/16. 6:22 PM.  History   Chief Complaint Chief Complaint  Patient presents with  . Suture / Staple Removal   The history is provided by the patient and medical records. No language interpreter was used.    HPI Comments:  Alan Foley is a 20 y.o. male who presents to the Emergency Department for a non-complicated suture removal.  Pt was seen in the ED on 06/22/16 for a 4-5 cm laceration to h is left middle finger after cutting it on a wheel rotor while removing parts from a car at the junkyard; he had 8 sutures placed.  Pt kept area clean and dry.  He denies fever, redness and swelling to the area and purulent discharge.  He has no PCP  Past Medical History:  Diagnosis Date  . Asthma   . Enlarged heart     There are no active problems to display for this patient.   History reviewed. No pertinent surgical history.     Home Medications    Prior to Admission medications   Medication Sig Start Date End Date Taking? Authorizing Provider  albuterol (PROVENTIL HFA;VENTOLIN HFA) 108 (90 BASE) MCG/ACT inhaler Inhale 1-2 puffs into the lungs every 6 (six) hours as needed for wheezing or shortness of breath. 09/20/15   Ace GinsSerena Y Sam, PA-C  cephALEXin (KEFLEX) 500 MG capsule Take 1 capsule (500 mg total) by mouth 4 (four) times daily. 06/22/16   Jerre SimonJessica L Focht, PA  ondansetron (ZOFRAN ODT) 4 MG disintegrating tablet Take 1 tablet (4 mg total) by mouth every 8 (eight) hours as needed for nausea or vomiting. 09/20/15   Carlene CoriaSerena Y Sam, PA-C    Family History History reviewed. No pertinent family history.  Social History Social History  Substance Use Topics  . Smoking status:  Never Smoker  . Smokeless tobacco: Never Used  . Alcohol use No     Allergies   Review of patient's allergies indicates no known allergies.   Review of Systems Review of Systems  Constitutional: Negative for fever.       Positive suture removal  Musculoskeletal: Negative for arthralgias and joint swelling.  Skin: Negative for color change.     Physical Exam Updated Vital Signs BP (!) 119/109   Pulse 71   Temp 98.8 F (37.1 C) (Oral)   Resp 16   SpO2 100%   Physical Exam  Constitutional: He appears well-developed and well-nourished. No distress.  HENT:  Head: Normocephalic and atraumatic.  Eyes: Conjunctivae are normal.  Cardiovascular: Normal rate.   Pulmonary/Chest: Effort normal.  Neurological: He is alert. He is not disoriented. GCS eye subscore is 4. GCS verbal subscore is 5. GCS motor subscore is 6.  Skin: Skin is warm and dry.  Well healing incision on left 3rd digit at PIP joint. No evidence of redness, swelling, warmth, or purulent discharge.   Psychiatric: He has a normal mood and affect.  Nursing note and vitals reviewed.    ED Treatments / Results   DIAGNOSTIC STUDIES:  Oxygen Saturation is 100% on RA, NML by my interpretation.    COORDINATION OF CARE:  6:22 PM Discussed treatment plan with pt at bedside and pt agreed to plan.  Labs (all  labs ordered are listed, but only abnormal results are displayed) Labs Reviewed - No data to display  EKG  EKG Interpretation None       Radiology No results found.  Procedures .Suture Removal Date/Time: 07/02/2016 6:34 PM Performed by: Lona Kettle Authorized by: Lona Kettle   Consent:    Consent obtained:  Verbal   Consent given by:  Patient   Risks discussed:  Pain, bleeding and wound separation   Alternatives discussed:  No treatment Location:    Location:  Upper extremity   Upper extremity location:  Hand   Hand location:  L long finger Procedure details:    Wound  appearance:  No signs of infection   Number of sutures removed:  8 Post-procedure details:    Post-removal:  Antibiotic ointment applied and Band-Aid applied   Patient tolerance of procedure:  Tolerated well, no immediate complications   (including critical care time)  Medications Ordered in ED Medications - No data to display   Initial Impression / Assessment and Plan / ED Course  I have reviewed the triage vital signs and the nursing notes.  Pertinent labs & imaging results that were available during my care of the patient were reviewed by me and considered in my medical decision making (see chart for details).  Clinical Course    Pt to ER for staple/suture removal and wound check as above.  Patient is afebrile and non-toxic appearing in NAD. VSS. No signs of infection. Procedure tolerated well. Scar minimization & return precautions given at dc. Follow up with PCP as needed. Pt voiced understanding and is agreeable.   Final Clinical Impressions(s) / ED Diagnoses   Final diagnoses:  Visit for suture removal    New Prescriptions New Prescriptions   No medications on file   I personally performed the services described in this documentation, which was scribed in my presence. The recorded information has been reviewed and is accurate.    Lona Kettle, [place] 07/02/16 1835    Raeford Razor, MD 07/02/16 2011

## 2016-07-02 NOTE — ED Triage Notes (Signed)
Pt here to have suture removed on finger. Had sutures placed 10 days ago

## 2017-05-26 ENCOUNTER — Encounter (HOSPITAL_COMMUNITY): Payer: Self-pay

## 2017-05-26 ENCOUNTER — Emergency Department (HOSPITAL_COMMUNITY)
Admission: EM | Admit: 2017-05-26 | Discharge: 2017-05-26 | Disposition: A | Discharge status: Home / Self Care | Payer: Self-pay | Attending: Emergency Medicine | Admitting: Emergency Medicine

## 2017-05-26 ENCOUNTER — Emergency Department (HOSPITAL_COMMUNITY): Payer: Self-pay

## 2017-05-26 DIAGNOSIS — Z79899 Other long term (current) drug therapy: Secondary | ICD-10-CM | POA: Insufficient documentation

## 2017-05-26 DIAGNOSIS — M25462 Effusion, left knee: Secondary | ICD-10-CM | POA: Insufficient documentation

## 2017-05-26 DIAGNOSIS — J45909 Unspecified asthma, uncomplicated: Secondary | ICD-10-CM | POA: Insufficient documentation

## 2017-05-26 MED ORDER — DICLOFENAC SODIUM 50 MG PO TBEC: 50.0000 mg | DELAYED_RELEASE_TABLET | Freq: Two times a day (BID) | ORAL | 0 refills | Status: DC

## 2017-05-26 MED ORDER — HYDROCODONE-ACETAMINOPHEN 5-325 MG PO TABS
1.0000 | ORAL_TABLET | Freq: Once | ORAL | Status: AC
  Administered 2017-05-26: 1 via ORAL
  Filled 2017-05-26: qty 1

## 2017-05-26 NOTE — ED Provider Notes (Signed)
[facility] DEPT Provider Note   CSN: [id number] Arrival date & time: 05/26/17  1402  By signing my name below, I, Deland Pretty, attest that this documentation has been prepared under the direction and in the presence of Kerrie Buffalo, NP Electronically Signed: Deland Pretty, ED Scribe. 05/26/17. 3:39 PM.  History   Chief Complaint Chief Complaint  Patient presents with  . Knee Pain   The history is provided by the patient. No language interpreter was used.  Knee Pain   This is a new problem. The current episode started more than 2 days ago. The problem occurs constantly. The problem has not changed since onset.The pain is present in the left knee. The pain is moderate. Pertinent negatives include no numbness and no tingling. The symptoms are aggravated by standing. He has tried nothing for the symptoms.   HPI Comments: Alan Foley is a 21 y.o. male who presents to the Emergency Department complaining of "sharp" intermittent left knee pain s/p an injury that occurred 3 days ago. He states that he was working when his upper leg "went the opposite way" of his lower leg. Pt states that ambulation and standing exacerbates his pain.He denies numbness, tingling and additional complaints at this time.   Patient thinks that his knee popped out of joint and then went back.    Past Medical History:  Diagnosis Date  . Asthma   . Enlarged heart     There are no active problems to display for this patient.   History reviewed. No pertinent surgical history.     Home Medications    Prior to Admission medications   Medication Sig Start Date End Date Taking? Authorizing Provider  albuterol (PROVENTIL HFA;VENTOLIN HFA) 108 (90 BASE) MCG/ACT inhaler Inhale 1-2 puffs into the lungs every 6 (six) hours as needed for wheezing or shortness of breath. 09/20/15   Sam, Ace Gins, PA-C  cephALEXin (KEFLEX) 500 MG capsule Take 1 capsule (500 mg total) by mouth 4 (four) times daily. 06/22/16    Focht, Joyce Copa, PA  diclofenac (VOLTAREN) 50 MG EC tablet Take 1 tablet (50 mg total) by mouth 2 (two) times daily. 05/26/17   Janne Napoleon, NP  ondansetron (ZOFRAN ODT) 4 MG disintegrating tablet Take 1 tablet (4 mg total) by mouth every 8 (eight) hours as needed for nausea or vomiting. 09/20/15   Sam, Ace Gins, PA-C    Family History History reviewed. No pertinent family history.  Social History Social History  Substance Use Topics  . Smoking status: Never Smoker  . Smokeless tobacco: Never Used  . Alcohol use No     Allergies   Patient has no known allergies.   Review of Systems Review of Systems  Constitutional: Negative for chills and fever.  HENT: Negative.   Respiratory: Negative for shortness of breath.   Gastrointestinal: Negative for nausea and vomiting.  Musculoskeletal: Positive for arthralgias.       Left knee pain   Skin: Negative for wound.  Neurological: Negative for tingling and numbness.     Physical Exam Updated Vital Signs BP (!) 153/83 (BP Location: Right Arm)   Pulse 87   Resp 18   SpO2 99%   Physical Exam  Constitutional: He is oriented to person, place, and time. He appears well-developed and well-nourished.  HENT:  Head: Normocephalic.  Eyes: EOM are normal.  Neck: Normal range of motion.  Cardiovascular: Normal rate.   Pulses:      Dorsalis pedis pulses are 2+  on the right side, and 2+ on the left side.  Pulmonary/Chest: Effort normal.  Musculoskeletal: He exhibits tenderness.       Right shoulder: He exhibits tenderness.       Left knee: He exhibits swelling. He exhibits no deformity, no laceration, no erythema and normal alignment. Decreased range of motion: due to pain. Tenderness found.  Pain and swelling to anterior aspect of left knee. Increased pain with flexion of the left knee. Pedal pulses 2+.  Neurological: He is alert and oriented to person, place, and time.  Psychiatric: He has a normal mood and affect.  Nursing note  and vitals reviewed.    ED Treatments / Results   DIAGNOSTIC STUDIES: Oxygen Saturation is 100% on R/A, normal by my interpretation.   COORDINATION OF CARE: 3:33 PM-Discussed next steps with pt. Pt verbalized understanding and is agreeable with the plan.   Labs (all labs ordered are listed, but only abnormal results are displayed) Labs Reviewed - No data to display  Radiology Dg Knee Complete 4 Views Left  Result Date: 05/26/2017 CLINICAL DATA:  Pt reports L knee pain since Wednesday. He states "I was working on something on a truck and my knee moved wrong and it popped 4 times." C/o pain medial to patella. EXAM: LEFT KNEE - COMPLETE 4+ VIEW COMPARISON:  None. FINDINGS: No fracture. There is a bone lesion in the distal femoral shaft with a thin sclerotic margin. This has a benign appearance. No other bone lesions. Knee joint is normally spaced and aligned.  No arthropathic change. There is a small joint effusion. Soft tissues are unremarkable. IMPRESSION: 1. No fracture or arthropathic change. 2. Small joint effusion. Electronically Signed   By: Amie Portland M.D.   On: 05/26/2017 15:59    Procedures Procedures (including critical care time)  Medications Ordered in ED Medications  HYDROcodone-acetaminophen (NORCO/VICODIN) 5-325 MG per tablet 1 tablet (1 tablet Oral Given 05/26/17 1635)     Initial Impression / Assessment and Plan / ED Course  I have reviewed the triage vital signs and the nursing notes.  Pertinent imaging results that were available during my care of the patient were reviewed by me and considered in my medical decision making (see chart for details).  21 y.o. male with left knee pain s/p injury at work a few days ago stable for d/c without fracture or dislocation on x-ray and no focal neuro deficits. Knee immobilizer applied, ice, elevation, pain management and f/u with ortho if symptoms persist. Return precautions discussed.   Final Clinical Impressions(s) /  ED Diagnoses   Final diagnoses:  Effusion of left knee    New Prescriptions Discharge Medication List as of 05/26/2017  4:27 PM    START taking these medications   Details  diclofenac (VOLTAREN) 50 MG EC tablet Take 1 tablet (50 mg total) by mouth 2 (two) times daily., Starting Sat 05/26/2017, Print      I personally performed the services described in this documentation, which was scribed in my presence. The recorded information has been reviewed and is accurate.    Kerrie Buffalo [place], [place] 05/26/17 1610    Alvira Monday, MD 05/28/17 1240

## 2017-05-26 NOTE — ED Triage Notes (Signed)
Pt reports L knee pain since Wednesday. He states "I was working on something and it moved wrong and it popped 4 times." A&Ox4. Ambulatory, but with pain.

## 2017-05-26 NOTE — Discharge Instructions (Signed)
Call Dr. Ophelia Charter' office for a follow up appointment. Return here for worsening symptoms.

## 2018-02-20 ENCOUNTER — Other Ambulatory Visit: Payer: Self-pay

## 2018-02-20 ENCOUNTER — Encounter (HOSPITAL_BASED_OUTPATIENT_CLINIC_OR_DEPARTMENT_OTHER): Payer: Self-pay

## 2018-02-20 ENCOUNTER — Emergency Department (HOSPITAL_BASED_OUTPATIENT_CLINIC_OR_DEPARTMENT_OTHER)
Admission: EM | Admit: 2018-02-20 | Discharge: 2018-02-21 | Disposition: A | Discharge status: Home / Self Care | Payer: Self-pay | Attending: Emergency Medicine | Admitting: Emergency Medicine

## 2018-02-20 DIAGNOSIS — G510 Bell's palsy: Secondary | ICD-10-CM | POA: Insufficient documentation

## 2018-02-20 DIAGNOSIS — Z79899 Other long term (current) drug therapy: Secondary | ICD-10-CM | POA: Insufficient documentation

## 2018-02-20 DIAGNOSIS — J45909 Unspecified asthma, uncomplicated: Secondary | ICD-10-CM | POA: Insufficient documentation

## 2018-02-20 NOTE — ED Triage Notes (Addendum)
Pt c/o left side facial numbness, inability to taste and shut left eye all the way, since yesterday, c/o recent "allergies," but denies recent illness

## 2018-02-21 MED ORDER — PREDNISONE 10 MG PO TABS: 20.0000 mg | ORAL_TABLET | Freq: Two times a day (BID) | ORAL | 0 refills | Status: DC

## 2018-02-21 MED ORDER — VALACYCLOVIR HCL 1 G PO TABS: 1000.0000 mg | ORAL_TABLET | Freq: Three times a day (TID) | ORAL | 0 refills | Status: DC

## 2018-02-21 NOTE — Discharge Instructions (Addendum)
Prednisone and Valtrex as prescribed.  Return to the emergency department if you develop left arm or leg weakness/numbness, or other new and concerning symptoms.

## 2018-02-21 NOTE — ED Provider Notes (Signed)
[facility] EMERGENCY DEPARTMENT Provider Note   CSN: [id number] Arrival date & time: 02/20/18  2319     History   Chief Complaint Chief Complaint  Patient presents with  . Facial Numbness    HPI Alan Foley is a 22 y.o. male.  Patient is a 22 year old male with no significant past medical history.  He presents with a several day history of left-sided facial numbness and decreased taste and sensation over the left side of his tongue.  He also reports difficulty closing his left eye.  This began in the absence of any injury or trauma.  He denies any arm or leg weakness.  He denies any fevers or chills.  There are no aggravating or alleviating factors.  The history is provided by the patient.    Past Medical History:  Diagnosis Date  . Asthma   . Enlarged heart     There are no active problems to display for this patient.   History reviewed. No pertinent surgical history.      Home Medications    Prior to Admission medications   Medication Sig Start Date End Date Taking? Authorizing Provider  albuterol (PROVENTIL HFA;VENTOLIN HFA) 108 (90 BASE) MCG/ACT inhaler Inhale 1-2 puffs into the lungs every 6 (six) hours as needed for wheezing or shortness of breath. 09/20/15   Sam, Ace Gins, PA-C  cephALEXin (KEFLEX) 500 MG capsule Take 1 capsule (500 mg total) by mouth 4 (four) times daily. 06/22/16   Focht, Joyce Copa, PA  diclofenac (VOLTAREN) 50 MG EC tablet Take 1 tablet (50 mg total) by mouth 2 (two) times daily. 05/26/17   Janne Napoleon, NP  ondansetron (ZOFRAN ODT) 4 MG disintegrating tablet Take 1 tablet (4 mg total) by mouth every 8 (eight) hours as needed for nausea or vomiting. 09/20/15   Sam, Ace Gins, PA-C    Family History No family history on file.  Social History Social History   Tobacco Use  . Smoking status: Never Smoker  . Smokeless tobacco: Never Used  Substance Use Topics  . Alcohol use: No  . Drug use: Yes    Types: Marijuana      Allergies   Patient has no known allergies.   Review of Systems Review of Systems  All other systems reviewed and are negative.    Physical Exam Updated Vital Signs BP 134/72 (BP Location: Left Arm)   Pulse 84   Temp 99.2 F (37.3 C) (Oral)   Resp 18   Ht  (1.753 m)   Wt 111 kg (244 lb 11.4 oz)   SpO2 99%   BMI 36.14 kg/m   Physical Exam  Constitutional: He is oriented to person, place, and time. He appears well-developed and well-nourished. No distress.  HENT:  Head: Normocephalic and atraumatic.  Mouth/Throat: Oropharynx is clear and moist.  Neck: Normal range of motion. Neck supple.  Cardiovascular: Normal rate and regular rhythm. Exam reveals no friction rub.  No murmur heard. Pulmonary/Chest: Effort normal and breath sounds normal. No respiratory distress. He has no wheezes. He has no rales.  Abdominal: Soft. Bowel sounds are normal. He exhibits no distension. There is no tenderness.  Musculoskeletal: Normal range of motion. He exhibits no edema.  Neurological: He is alert and oriented to person, place, and time. A cranial nerve deficit is present. He exhibits normal muscle tone. Coordination normal.  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  There is left facial droop that involves the forehead.  He has decreased strength with eyelid closing.  Skin: Skin is warm and dry. He is not diaphoretic.  Nursing note and vitals reviewed.    ED Treatments / Results  Labs (all labs ordered are listed, but only abnormal results are displayed) Labs Reviewed - No data to display  EKG None  Radiology No results found.  Procedures Procedures (including critical care time)  Medications Ordered in ED Medications - No data to display   Initial Impression / Assessment and Plan / ED Course  I have reviewed the triage vital signs and the nursing notes.  Pertinent labs & imaging results that were available during my care of the patient were reviewed by me and  considered in my medical decision making (see chart for details).  The patient's presentation and physical examination is very consistent with a Bell's palsy.  He has no extremity involvement.  There is involvement of the left temporal musculature as well as decreased strength with eye closing.  I see no indication for imaging or further studies.  He will be treated with prednisone and Valtrex.  He is to return as needed for any problems.  Final Clinical Impressions(s) / ED Diagnoses   Final diagnoses:  None    ED Discharge Orders    None       Geoffery Lyons, MD 02/21/18 [phone]

## 2018-09-28 ENCOUNTER — Other Ambulatory Visit: Payer: Self-pay

## 2018-09-28 ENCOUNTER — Emergency Department (HOSPITAL_COMMUNITY)
Admission: EM | Admit: 2018-09-28 | Discharge: 2018-09-28 | Disposition: A | Discharge status: Home / Self Care | Payer: Self-pay | Attending: Emergency Medicine | Admitting: Emergency Medicine

## 2018-09-28 ENCOUNTER — Emergency Department (HOSPITAL_COMMUNITY): Payer: Self-pay

## 2018-09-28 ENCOUNTER — Encounter (HOSPITAL_COMMUNITY): Payer: Self-pay | Admitting: Emergency Medicine

## 2018-09-28 DIAGNOSIS — R079 Chest pain, unspecified: Secondary | ICD-10-CM | POA: Insufficient documentation

## 2018-09-28 DIAGNOSIS — R05 Cough: Secondary | ICD-10-CM | POA: Insufficient documentation

## 2018-09-28 DIAGNOSIS — R109 Unspecified abdominal pain: Secondary | ICD-10-CM | POA: Insufficient documentation

## 2018-09-28 DIAGNOSIS — J029 Acute pharyngitis, unspecified: Secondary | ICD-10-CM | POA: Insufficient documentation

## 2018-09-28 DIAGNOSIS — R509 Fever, unspecified: Secondary | ICD-10-CM | POA: Insufficient documentation

## 2018-09-28 DIAGNOSIS — Z79899 Other long term (current) drug therapy: Secondary | ICD-10-CM | POA: Insufficient documentation

## 2018-09-28 DIAGNOSIS — J45909 Unspecified asthma, uncomplicated: Secondary | ICD-10-CM | POA: Insufficient documentation

## 2018-09-28 DIAGNOSIS — R0602 Shortness of breath: Secondary | ICD-10-CM | POA: Insufficient documentation

## 2018-09-28 DIAGNOSIS — R6889 Other general symptoms and signs: Secondary | ICD-10-CM

## 2018-09-28 LAB — INFLUENZA PANEL BY PCR (TYPE A & B)
INFLAPCR: NEGATIVE
INFLBPCR: POSITIVE — AB

## 2018-09-28 MED ORDER — AEROCHAMBER PLUS FLO-VU LARGE MISC: 1.0000 | Freq: Once | Status: DC

## 2018-09-28 MED ORDER — OSELTAMIVIR PHOSPHATE 75 MG PO CAPS: 75.0000 mg | ORAL_CAPSULE | Freq: Two times a day (BID) | ORAL | 0 refills | Status: AC

## 2018-09-28 MED ORDER — IBUPROFEN 800 MG PO TABS
800.0000 mg | ORAL_TABLET | Freq: Once | ORAL | Status: AC
  Administered 2018-09-28: 800 mg via ORAL
  Filled 2018-09-28: qty 1

## 2018-09-28 MED ORDER — ALBUTEROL SULFATE (2.5 MG/3ML) 0.083% IN NEBU
2.5000 mg | INHALATION_SOLUTION | Freq: Once | RESPIRATORY_TRACT | Status: AC
  Administered 2018-09-28: 2.5 mg via RESPIRATORY_TRACT
  Filled 2018-09-28: qty 3

## 2018-09-28 MED ORDER — ALBUTEROL SULFATE HFA 108 (90 BASE) MCG/ACT IN AERS
1.0000 | INHALATION_SPRAY | RESPIRATORY_TRACT | Status: DC | PRN
  Administered 2018-09-28: 2 via RESPIRATORY_TRACT
  Filled 2018-09-28: qty 6.7

## 2018-09-28 MED ORDER — SODIUM CHLORIDE 0.9 % IV BOLUS
1000.0000 mL | Freq: Once | INTRAVENOUS | Status: AC
  Administered 2018-09-28: 1000 mL via INTRAVENOUS

## 2018-09-28 MED ORDER — OSELTAMIVIR PHOSPHATE 75 MG PO CAPS
75.0000 mg | ORAL_CAPSULE | Freq: Once | ORAL | Status: AC
  Administered 2018-09-28: 75 mg via ORAL
  Filled 2018-09-28: qty 1

## 2018-09-28 NOTE — ED Provider Notes (Signed)
[facility] EMERGENCY DEPARTMENT Provider Note   CSN: [id number] Arrival date & time: 09/28/18  0051     History   Chief Complaint Chief Complaint  Patient presents with  . Shortness of Breath    HPI Alan Foley is a 22 y.o. male.  The history is provided by the patient.  Shortness of Breath  This is a new problem. The average episode lasts 1 day. The problem occurs frequently.The current episode started yesterday. The problem has been gradually worsening. Associated symptoms include a fever, sore throat, cough, chest pain, vomiting and abdominal pain. Pertinent negatives include no neck pain, no wheezing, no syncope, no rash, no leg pain and no claudication. It is unknown what precipitated the problem. He has tried nothing for the symptoms.    Past Medical History:  Diagnosis Date  . Asthma   . Enlarged heart     There are no active problems to display for this patient.   Past Surgical History:  Procedure Laterality Date  . FINGER SURGERY          Home Medications    Prior to Admission medications   Medication Sig Start Date End Date Taking? Authorizing Provider  albuterol (PROVENTIL HFA;VENTOLIN HFA) 108 (90 BASE) MCG/ACT inhaler Inhale 1-2 puffs into the lungs every 6 (six) hours as needed for wheezing or shortness of breath. 09/20/15   Sam, Ace Gins, PA-C  cephALEXin (KEFLEX) 500 MG capsule Take 1 capsule (500 mg total) by mouth 4 (four) times daily. 06/22/16   Focht, Joyce Copa, PA  diclofenac (VOLTAREN) 50 MG EC tablet Take 1 tablet (50 mg total) by mouth 2 (two) times daily. 05/26/17   Janne Napoleon, NP  ondansetron (ZOFRAN ODT) 4 MG disintegrating tablet Take 1 tablet (4 mg total) by mouth every 8 (eight) hours as needed for nausea or vomiting. 09/20/15   Sam, Ace Gins, PA-C  oseltamivir (TAMIFLU) 75 MG capsule Take 1 capsule (75 mg total) by mouth 2 (two) times daily for 10 days. 09/28/18 10/08/18  Garnette Gunner, MD  predniSONE  (DELTASONE) 10 MG tablet Take 2 tablets (20 mg total) by mouth 2 (two) times daily. 02/21/18   Geoffery Lyons, MD  valACYclovir (VALTREX) 1000 MG tablet Take 1 tablet (1,000 mg total) by mouth 3 (three) times daily. 02/21/18   Geoffery Lyons, MD    Family History No family history on file.  Social History Social History   Tobacco Use  . Smoking status: Never Smoker  . Smokeless tobacco: Never Used  Substance Use Topics  . Alcohol use: No  . Drug use: Not Currently    Types: Marijuana     Allergies   Patient has no known allergies.   Review of Systems Review of Systems  Constitutional: Positive for fever.  HENT: Positive for sore throat.   Respiratory: Positive for cough and shortness of breath. Negative for wheezing.   Cardiovascular: Positive for chest pain. Negative for claudication and syncope.  Gastrointestinal: Positive for abdominal pain and vomiting.  Musculoskeletal: Negative for neck pain.  Skin: Negative for rash.     Physical Exam Updated Vital Signs BP 120/66   Pulse 91   Temp 100.3 F (37.9 C) (Oral)   Resp 20   SpO2 100%   Physical Exam Constitutional:      General: He is in acute distress.  HENT:     Head: Normocephalic and atraumatic.     Mouth/Throat:     Mouth: Mucous membranes are  moist.  Eyes:     Extraocular Movements: Extraocular movements intact.     Pupils: Pupils are equal, round, and reactive to light.  Neck:     Musculoskeletal: Neck supple.     Vascular: No JVD.  Cardiovascular:     Rate and Rhythm: Tachycardia present.     Heart sounds: No murmur. No friction rub.  Pulmonary:     Effort: Tachypnea and respiratory distress present.     Breath sounds: No decreased breath sounds, wheezing, rhonchi or rales.  Chest:     Chest wall: No mass or deformity.  Abdominal:     Palpations: Abdomen is soft. There is no mass.     Tenderness: There is no abdominal tenderness.  Musculoskeletal:     Right lower leg: No edema.     Left  lower leg: No edema.  Lymphadenopathy:     Cervical: No cervical adenopathy.  Skin:    General: Skin is warm and dry.  Neurological:     General: No focal deficit present.     Mental Status: He is alert.     Cranial Nerves: No cranial nerve deficit.  Psychiatric:        Mood and Affect: Mood is anxious.      ED Treatments / Results  Labs (all labs ordered are listed, but only abnormal results are displayed) Labs Reviewed  INFLUENZA PANEL BY PCR (TYPE A & B)    EKG EKG Interpretation  Date/Time:  Saturday September 28 2018 02:11:10 EST Ventricular Rate:  89 PR Interval:    QRS Duration: 80 QT Interval:  315 QTC Calculation: 384 R Axis:   58 Text Interpretation:  Sinus rhythm No significant change since last tracing Confirmed by Melene PlanFloyd, Dan [phone]([id number]) on 09/28/2018 2:33:11 AM   Radiology Dg Chest 2 View  Result Date: 09/28/2018 CLINICAL DATA:  Acute onset of generalized weakness, cough and diarrhea. EXAM: CHEST - 2 VIEW COMPARISON:  Chest radiograph performed 09/20/2015 FINDINGS: The lungs are well-aerated and clear. There is no evidence of focal opacification, pleural effusion or pneumothorax. The heart is normal in size; the mediastinal contour is within normal limits. No acute osseous abnormalities are seen. IMPRESSION: No acute cardiopulmonary process seen. Electronically Signed   By: Roanna RaiderJeffery  Chang M.D.   On: 09/28/2018 01:51    Procedures Procedures (including critical care time)  Medications Ordered in ED Medications  albuterol (PROVENTIL HFA;VENTOLIN HFA) 108 (90 Base) MCG/ACT inhaler 1-2 puff (has no administration in time range)  AEROCHAMBER PLUS FLO-VU LARGE MISC 1 each (has no administration in time range)  oseltamivir (TAMIFLU) capsule 75 mg (has no administration in time range)  ibuprofen (ADVIL,MOTRIN) tablet 800 mg (800 mg Oral Given 09/28/18 0159)  sodium chloride 0.9 % bolus 1,000 mL (1,000 mLs Intravenous New Bag/Given 09/28/18 0204)  albuterol  (PROVENTIL) (2.5 MG/3ML) 0.083% nebulizer solution 2.5 mg (2.5 mg Nebulization Given 09/28/18 0209)     Initial Impression / Assessment and Plan / ED Course  I have reviewed the triage vital signs and the nursing notes.  Pertinent labs & imaging results that were available during my care of the patient were reviewed by me and considered in my medical decision making (see chart for details).  Clinical Course as of Sep 28 326  Sat Sep 28, 2018  0321 Influenza panel by PCR (type A & B) [AT]    Clinical Course User Index [AT] Garnette Gunnerhompson, Aaron B, MD    Patient with history of asthma.  Patient be uncomfortable  although no wheeze.  Will treat with albuterol nebs, get x-ray to rule out pneumonia, flu swab, ibuprofen, 1 L normal saline bolus EKG.  We will follow-up clinical status after this has taken place. Lacerations   Interval update Chest x-ray normal. Patient appears much improved after receiving ibuprofen and fluids. Flu test still pending but pt wanting to go home. Will tx with tamiflu for full course.   Final Clinical Impressions(s) / ED Diagnoses   Final diagnoses:  Flu-like symptoms    ED Discharge Orders         Ordered    oseltamivir (TAMIFLU) 75 MG capsule  2 times daily     09/28/18 0326           Garnette Gunnerhompson, Aaron B, MD 09/28/18 0329    Melene PlanFloyd, Dan, DO 09/28/18 [phone]

## 2018-09-28 NOTE — ED Notes (Signed)
Patient transported to X-ray 

## 2018-09-28 NOTE — ED Notes (Signed)
Patient verbalizes understanding of discharge instructions. Opportunity for questioning and answers were provided. Armband removed by staff, pt discharged from ED ambulatory.   

## 2018-09-28 NOTE — ED Triage Notes (Signed)
Pt reports shortness of breath since yesterday, with hx of asthma. Pt reports fever and chills x 3 days. Pt denies sick contact. Pt reports productive cough with green sputum

## 2019-03-15 ENCOUNTER — Emergency Department (HOSPITAL_COMMUNITY)
Admission: EM | Admit: 2019-03-15 | Discharge: 2019-03-15 | Disposition: A | Discharge status: Home / Self Care | Payer: Self-pay | Attending: Emergency Medicine | Admitting: Emergency Medicine

## 2019-03-15 ENCOUNTER — Encounter (HOSPITAL_COMMUNITY): Payer: Self-pay | Admitting: *Deleted

## 2019-03-15 ENCOUNTER — Emergency Department (HOSPITAL_COMMUNITY): Payer: Self-pay

## 2019-03-15 ENCOUNTER — Other Ambulatory Visit: Payer: Self-pay

## 2019-03-15 DIAGNOSIS — E669 Obesity, unspecified: Secondary | ICD-10-CM | POA: Insufficient documentation

## 2019-03-15 DIAGNOSIS — Z79899 Other long term (current) drug therapy: Secondary | ICD-10-CM | POA: Insufficient documentation

## 2019-03-15 DIAGNOSIS — M545 Low back pain, unspecified: Secondary | ICD-10-CM

## 2019-03-15 DIAGNOSIS — Z6836 Body mass index (BMI) 36.0-36.9, adult: Secondary | ICD-10-CM | POA: Insufficient documentation

## 2019-03-15 MED ORDER — DICLOFENAC SODIUM 75 MG PO TBEC: 75.0000 mg | DELAYED_RELEASE_TABLET | Freq: Two times a day (BID) | ORAL | 0 refills | Status: DC

## 2019-03-15 MED ORDER — METHOCARBAMOL 500 MG PO TABS: 500.0000 mg | ORAL_TABLET | Freq: Two times a day (BID) | ORAL | 0 refills | Status: DC

## 2019-03-15 MED ORDER — LIDOCAINE 5 % EX PTCH
1.0000 | MEDICATED_PATCH | CUTANEOUS | Status: DC
  Administered 2019-03-15: 1 via TRANSDERMAL
  Filled 2019-03-15: qty 1

## 2019-03-15 MED ORDER — METHOCARBAMOL 500 MG PO TABS
500.0000 mg | ORAL_TABLET | Freq: Once | ORAL | Status: AC
  Administered 2019-03-15: 500 mg via ORAL
  Filled 2019-03-15: qty 1

## 2019-03-15 MED ORDER — LIDOCAINE 5 % EX PTCH: 1.0000 | MEDICATED_PATCH | Freq: Two times a day (BID) | CUTANEOUS | 0 refills | Status: DC

## 2019-03-15 MED ORDER — KETOROLAC TROMETHAMINE 15 MG/ML IJ SOLN
15.0000 mg | Freq: Once | INTRAMUSCULAR | Status: AC
  Administered 2019-03-15: 15 mg via INTRAMUSCULAR
  Filled 2019-03-15: qty 1

## 2019-03-15 NOTE — ED Notes (Signed)
Patient transported to X-ray 

## 2019-03-15 NOTE — ED Provider Notes (Addendum)
[facility] EMERGENCY DEPARTMENT Provider Note   CSN: [id number] Arrival date & time: 03/15/19  1732    History   Chief Complaint Chief Complaint  Patient presents with   Back Pain    HPI Alan Foley is a 23 y.o. male with history of asthma and enlarged heart presenting for back pain.  Patient reports that over the last 6 weeks he has had lower back pain that he describes as a moderate intensity constant throbbing sensation worsened with movement and without alleviating factors he has not attempted any medication prior to arrival for his pain.  Patient reports that 3 days ago he was lifting an engine at work and was having his normal back pain he finished his job and then over the course of the next few hours had a worsening of his back pain he now describes his pain as severe.  He denies any trauma/injury and no acute worsening of his pain with lifting the engine.  He denies any fever/chills, IV drug use, history of cancer, numbness/weakness or tingling, saddle area paresthesias, urinary retention or bowel/bladder incontinence.     HPI  Past Medical History:  Diagnosis Date   Asthma    Enlarged heart     There are no active problems to display for this patient.   Past Surgical History:  Procedure Laterality Date   FINGER SURGERY          Home Medications    Prior to Admission medications   Medication Sig Start Date End Date Taking? Authorizing Provider  albuterol (PROVENTIL HFA;VENTOLIN HFA) 108 (90 BASE) MCG/ACT inhaler Inhale 1-2 puffs into the lungs every 6 (six) hours as needed for wheezing or shortness of breath. 09/20/15   Sam, Olivia Canter, PA-C  cephALEXin (KEFLEX) 500 MG capsule Take 1 capsule (500 mg total) by mouth 4 (four) times daily. 06/22/16   Focht, Fraser Din, PA  diclofenac (VOLTAREN) 50 MG EC tablet Take 1 tablet (50 mg total) by mouth 2 (two) times daily. 05/26/17   Ashley Murrain, NP  ondansetron (ZOFRAN ODT) 4 MG disintegrating  tablet Take 1 tablet (4 mg total) by mouth every 8 (eight) hours as needed for nausea or vomiting. 09/20/15   Sam, Olivia Canter, PA-C  predniSONE (DELTASONE) 10 MG tablet Take 2 tablets (20 mg total) by mouth 2 (two) times daily. 02/21/18   Veryl Speak, MD  valACYclovir (VALTREX) 1000 MG tablet Take 1 tablet (1,000 mg total) by mouth 3 (three) times daily. 02/21/18   Veryl Speak, MD    Family History History reviewed. No pertinent family history.  Social History Social History   Tobacco Use   Smoking status: Never Smoker   Smokeless tobacco: Never Used  Substance Use Topics   Alcohol use: No   Drug use: Not Currently    Types: Marijuana     Allergies   Patient has no known allergies.   Review of Systems Review of Systems  Constitutional: Negative.  Negative for chills and fever.  Gastrointestinal: Negative.  Negative for abdominal pain.  Genitourinary: Negative.  Negative for dysuria and hematuria.  Musculoskeletal: Positive for back pain. Negative for neck pain.  Neurological: Negative.  Negative for weakness and numbness.       Denies saddle area paresthesias Denies urinary retention Denies bowel/bladder incontinence   Physical Exam Updated Vital Signs BP (!) 131/96    Pulse 79    Temp 98.5 F (36.9 C) (Oral)    Resp 16  Ht 5\' 9"  (1.753 m)    SpO2 100%    BMI 36.14 kg/m   Physical Exam Constitutional:      General: He is not in acute distress.    Appearance: Normal appearance. He is obese. He is not ill-appearing or diaphoretic.  HENT:     Head: Normocephalic and atraumatic. No raccoon eyes or Battle's sign.     Jaw: There is normal jaw occlusion. No trismus.     Right Ear: Tympanic membrane, ear canal and external ear normal. No hemotympanum.     Left Ear: Tympanic membrane, ear canal and external ear normal. No hemotympanum.     Nose: Nose normal.     Mouth/Throat:     Lips: Pink.     Mouth: Mucous membranes are moist.     Pharynx: Oropharynx is clear.  Uvula midline.  Eyes:     General: Vision grossly intact. Gaze aligned appropriately.     Extraocular Movements: Extraocular movements intact.     Conjunctiva/sclera: Conjunctivae normal.     Pupils: Pupils are equal, round, and reactive to light.  Neck:     Musculoskeletal: Normal range of motion and neck supple. No neck rigidity.     Trachea: Trachea and phonation normal. No tracheal tenderness or tracheal deviation.  Cardiovascular:     Rate and Rhythm: Normal rate and regular rhythm.     Pulses:          Radial pulses are 2+ on the right side and 2+ on the left side.       Dorsalis pedis pulses are 2+ on the right side and 2+ on the left side.       Posterior tibial pulses are 2+ on the right side and 2+ on the left side.     Heart sounds: Normal heart sounds.  Pulmonary:     Effort: Pulmonary effort is normal. No respiratory distress.     Breath sounds: Normal breath sounds and air entry. No decreased breath sounds or rhonchi.  Abdominal:     General: Bowel sounds are normal. There is no distension.     Palpations: Abdomen is soft. There is no pulsatile mass.     Tenderness: There is no abdominal tenderness. There is no right CVA tenderness, left CVA tenderness, guarding or rebound.  Musculoskeletal:       Back:     Comments: No midline C/T spinal tenderness to palpation no deformity, crepitus, or step-off noted. No sign of injury to the neck or back. - Patient with diffuse lumbar tenderness to palpation including midline lumbar spine no focal tenderness on palpation.  No sign of injury. - Hips stable to compression bilaterally without pain.  Feet:     Right foot:     Protective Sensation: 5 sites tested. 5 sites sensed.     Left foot:     Protective Sensation: 5 sites tested. 5 sites sensed.  Skin:    General: Skin is warm and dry.     Capillary Refill: Capillary refill takes less than 2 seconds.  Neurological:     Mental Status: He is alert.     GCS: GCS eye subscore  is 4. GCS verbal subscore is 5. GCS motor subscore is 6.     Comments: Speech is clear and goal oriented, follows commands Major Cranial nerves without deficit, no facial droop Normal strength in upper and lower extremities bilaterally including dorsiflexion and plantar flexion, strong and equal grip strength Sensation normal to light and sharp  touch Moves extremities without ataxia, coordination intact DTR 2+ bilateral patella, no clonus of the feet  Psychiatric:        Behavior: Behavior is cooperative.    ED Treatments / Results  Labs (all labs ordered are listed, but only abnormal results are displayed) Labs Reviewed - No data to display  EKG None  Radiology No results found.  Procedures Procedures (including critical care time)  Medications Ordered in ED Medications  ketorolac (TORADOL) 15 MG/ML injection 15 mg (has no administration in time range)  methocarbamol (ROBAXIN) tablet 500 mg (has no administration in time range)  lidocaine (LIDODERM) 5 % 1 patch (has no administration in time range)     Initial Impression / Assessment and Plan / ED Course  I have reviewed the triage vital signs and the nursing notes.  Pertinent labs & imaging results that were available during my care of the patient were reviewed by me and considered in my medical decision making (see chart for details).    Alan Foley is a 23 y.o. male presenting with lower back pain that is been present for 6 weeks worsening over the last 3 days. Patient denies history of trauma, fever, IV drug use, night sweats, weight loss, cancer, saddle anesthesia, urinary rentention, bowel/bladder incontinence. No neurological deficits and normal neuro exam.   Suspect musculoskeletal etiology of patient's pain. Pain is consistently reproducible with palpation of the back musculature. Abdomen soft/nontender and without pulsatile mass. Patient with equal pedal pulses. Doubt spinal epidural abscess, cauda equina or  AAA.  As patient's pain has been present for approximately 6 weeks we will perform plain film of the lumbar spine to evaluate for abnormality.  Will treat pain in the meantime, Toradol 15 mg IM, patient denies history of CKD or gastric ulcers/bleeding. Robaxin 500mg  given, patient informed to avoid driving or operating heavy machinery while taking muscle relaxer.  Lidoderm patch given. - DG lumbar spine:  IMPRESSION:  There appear to be minimally displaced fractures of the bilateral L5  transverse processes, of uncertain acuity. No other evidence of  fracture or dislocation of the lumbar spine. Disc spaces and  vertebral body heights are preserved. Lumbar disc and neural  foraminal pathology may be further evaluated by MRI if indicated by  localizing signs and symptoms.   Case discussed with Dr. Clarene DukeLittle will perform CT of the lumbar spine to evaluate for further fracture and abnormality.  As patient is without neurologic symptoms no indication for MRI at this time. - Patient reassessed resting comfortably no acute distress updated on care plan and is agreeable at this time.  Endorses improvement of pain following medications today.  Care handoff given to Langston MaskerKaren Sofia PA-C at shift change, CT pending, disposition to be completed by oncoming team.  Note: Portions of this report may have been transcribed using voice recognition software. Every effort was made to ensure accuracy; however, inadvertent computerized transcription errors may still be present. Final Clinical Impressions(s) / ED Diagnoses   Final diagnoses:  None    ED Discharge Orders    None       Bill SalinasMorelli, Juanluis Guastella A, PA-C 03/15/19 1909    Elizabeth PalauMorelli, Aleisa Howk A, PA-C 03/15/19 1912    Little, Ambrose Finlandachel Morgan, MD 03/15/19 2124

## 2019-03-15 NOTE — ED Provider Notes (Signed)
Pt's care turned over to me.  Ct pending to evaluate for possible fracture.  Ct LS spine reviewed and discussed with pt.  Pt reports relief with medications  An After Visit Summary was printed and given to the patient. Meds ordered this encounter  Medications  . ketorolac (TORADOL) 15 MG/ML injection 15 mg  . methocarbamol (ROBAXIN) tablet 500 mg  . lidocaine (LIDODERM) 5 % 1 patch  . lidocaine (LIDODERM) 5 %    Sig: Place 1 patch onto the skin every 12 (twelve) hours. Remove & Discard patch within 12 hours or as directed by MD    Dispense:  10 patch    Refill:  0    Order Specific Question:   Supervising Provider    Answer:   Noemi Chapel [[id number]]  . diclofenac (VOLTAREN) 75 MG EC tablet    Sig: Take 1 tablet (75 mg total) by mouth 2 (two) times daily.    Dispense:  20 tablet    Refill:  0    Order Specific Question:   Supervising Provider    Answer:   MILLER, BRIAN [[id number]]  . methocarbamol (ROBAXIN) 500 MG tablet    Sig: Take 1 tablet (500 mg total) by mouth 2 (two) times daily.    Dispense:  20 tablet    Refill:  0    Order Specific Question:   Supervising Provider    Answer:   Noemi Chapel [[id number]]      Fransico Meadow, PA-C 03/15/19 2025    Little, Wenda Overland, MD 03/15/19 2124

## 2019-03-15 NOTE — ED Triage Notes (Signed)
PT reports after lifting an engine  On Thursday his back  Started to hurt worse. Pt reports he has had lesser back pain for one month before lifting the engine.

## 2019-03-15 NOTE — ED Notes (Signed)
Pt to CT

## 2019-03-15 NOTE — ED Notes (Signed)
Patient transported to CT 

## 2019-03-15 NOTE — ED Notes (Signed)
Patient returned from CT

## 2019-04-23 ENCOUNTER — Emergency Department (HOSPITAL_COMMUNITY)
Admission: EM | Admit: 2019-04-23 | Discharge: 2019-04-23 | Disposition: A | Discharge status: Home / Self Care | Payer: Self-pay | Attending: Emergency Medicine | Admitting: Emergency Medicine

## 2019-04-23 DIAGNOSIS — R112 Nausea with vomiting, unspecified: Secondary | ICD-10-CM | POA: Insufficient documentation

## 2019-04-23 DIAGNOSIS — J45909 Unspecified asthma, uncomplicated: Secondary | ICD-10-CM | POA: Insufficient documentation

## 2019-04-23 DIAGNOSIS — Z79899 Other long term (current) drug therapy: Secondary | ICD-10-CM | POA: Insufficient documentation

## 2019-04-23 MED ORDER — ONDANSETRON 4 MG PO TBDP: 4.0000 mg | ORAL_TABLET | Freq: Once | ORAL | Status: DC

## 2019-04-23 MED ORDER — FAMOTIDINE 20 MG PO TABS: 20.0000 mg | ORAL_TABLET | Freq: Two times a day (BID) | ORAL | 0 refills | Status: DC

## 2019-04-23 MED ORDER — ONDANSETRON 4 MG PO TBDP: ORAL_TABLET | ORAL | 0 refills | Status: DC

## 2019-04-23 NOTE — Discharge Instructions (Signed)
You can use Zofran as needed for nausea.  Please take Pepcid twice daily for the next 5 days to help with any esophageal irritation from vomiting.  Return to the emergency department if you have persistent vomiting, increasing amounts of blood in your vomit, fevers, abdominal pain or any other new or concerning symptoms.

## 2019-04-23 NOTE — ED Notes (Signed)
ESignature not working. Pt received d/c instructions and understands them

## 2019-04-23 NOTE — ED Provider Notes (Signed)
[place] EMERGENCY DEPARTMENT Provider Note   CSN: [id number] Arrival date & time: 04/23/19  3295    History   Chief Complaint Chief Complaint  Patient presents with  . Emesis    HPI Alan Foley is a 23 y.o. male.     Alan Foley is a 23 y.o. male with a history of asthma and enlarged heart, who presents to the ED for evaluation of emesis.  He reports this morning he woke up felt a bit nauseated and had 3 episodes of vomiting.  He reports that on the last episode of vomiting he noted a few reddish-pink areas concerning for blood.  He reports since this episode of vomiting he has been feeling much better he denies any abdominal pain, no persisting nausea, no diarrhea, constipation, melena or hematochezia.  He has not had any fevers or chills.  He reports he thinks he may have eaten something that upset his stomach last night, reports all of his symptoms have resolved.  He denies any lightheadedness, fatigue or dizziness.  He spoke to a coworker prior to going into work today who recommended that he come to get checked out prior to coming to work.  He reports he has had some similar episodes in the past where his stomach will be a bit upset and he gets nauseated, seems to get what ever is bothering his system out and then seems to feel much better.  He currently denies any nausea or abdominal pain.  No meds prior to arrival.  Has been able to keep down brought in water prior to arrival.     Past Medical History:  Diagnosis Date  . Asthma   . Enlarged heart     There are no active problems to display for this patient.   Past Surgical History:  Procedure Laterality Date  . FINGER SURGERY          Home Medications    Prior to Admission medications   Medication Sig Start Date End Date Taking? Authorizing Provider  albuterol (PROVENTIL HFA;VENTOLIN HFA) 108 (90 BASE) MCG/ACT inhaler Inhale 1-2 puffs into the lungs every 6 (six) hours as needed for  wheezing or shortness of breath. 09/20/15   Sam, Olivia Canter, PA-C  cephALEXin (KEFLEX) 500 MG capsule Take 1 capsule (500 mg total) by mouth 4 (four) times daily. 06/22/16   Focht, Fraser Din, PA  diclofenac (VOLTAREN) 75 MG EC tablet Take 1 tablet (75 mg total) by mouth 2 (two) times daily. 03/15/19   Fransico Meadow, PA-C  famotidine (PEPCID) 20 MG tablet Take 1 tablet (20 mg total) by mouth 2 (two) times daily. 04/23/19   Jacqlyn Larsen, PA-C  lidocaine (LIDODERM) 5 % Place 1 patch onto the skin every 12 (twelve) hours. Remove & Discard patch within 12 hours or as directed by MD 03/15/19 03/14/20  Fransico Meadow, PA-C  methocarbamol (ROBAXIN) 500 MG tablet Take 1 tablet (500 mg total) by mouth 2 (two) times daily. 03/15/19   Fransico Meadow, PA-C  ondansetron (ZOFRAN ODT) 4 MG disintegrating tablet 4mg  ODT q4 hours prn nausea/vomit 04/23/19   Jacqlyn Larsen, PA-C  predniSONE (DELTASONE) 10 MG tablet Take 2 tablets (20 mg total) by mouth 2 (two) times daily. 02/21/18   Veryl Speak, MD  valACYclovir (VALTREX) 1000 MG tablet Take 1 tablet (1,000 mg total) by mouth 3 (three) times daily. 02/21/18   Veryl Speak, MD    Family History No family history on  file.  Social History Social History   Tobacco Use  . Smoking status: Never Smoker  . Smokeless tobacco: Never Used  Substance Use Topics  . Alcohol use: No  . Drug use: Not Currently    Types: Marijuana     Allergies   Patient has no known allergies.   Review of Systems Review of Systems  Constitutional: Negative for chills and fever.  HENT: Negative.   Eyes: Negative for visual disturbance.  Respiratory: Negative for cough and shortness of breath.   Cardiovascular: Negative for chest pain.  Gastrointestinal: Positive for nausea and vomiting. Negative for abdominal pain, blood in stool, constipation and diarrhea.  Genitourinary: Negative for dysuria and frequency.  Musculoskeletal: Negative for arthralgias and myalgias.  Skin: Negative  for color change and rash.  Neurological: Negative for dizziness, syncope and light-headedness.     Physical Exam Updated Vital Signs BP 119/81 (BP Location: Right Arm)   Pulse 74   Temp 98.4 F (36.9 C) (Oral)   Resp 16   SpO2 95%   Physical Exam Vitals signs and nursing note reviewed.  Constitutional:      General: He is not in acute distress.    Appearance: Normal appearance. He is well-developed. He is obese. He is not ill-appearing or diaphoretic.  HENT:     Head: Normocephalic and atraumatic.     Mouth/Throat:     Mouth: Mucous membranes are moist.     Pharynx: Oropharynx is clear.  Eyes:     General:        Right eye: No discharge.        Left eye: No discharge.     Pupils: Pupils are equal, round, and reactive to light.  Neck:     Musculoskeletal: Neck supple.  Cardiovascular:     Rate and Rhythm: Normal rate and regular rhythm.     Heart sounds: Normal heart sounds. No murmur. No friction rub. No gallop.   Pulmonary:     Effort: Pulmonary effort is normal. No respiratory distress.     Breath sounds: Normal breath sounds. No wheezing or rales.     Comments: Respirations equal and unlabored, patient able to speak in full sentences, lungs clear to auscultation bilaterally Abdominal:     General: Bowel sounds are normal. There is no distension.     Palpations: Abdomen is soft. There is no mass.     Tenderness: There is no abdominal tenderness. There is no guarding.     Comments: Abdomen soft, nondistended, nontender to palpation in all quadrants without guarding or peritoneal signs  Musculoskeletal:        General: No deformity.  Skin:    General: Skin is warm and dry.     Capillary Refill: Capillary refill takes less than 2 seconds.  Neurological:     Mental Status: He is alert.     Coordination: Coordination normal.     Comments: Speech is clear, able to follow commands Moves extremities without ataxia, coordination intact  Psychiatric:        Mood and  Affect: Mood normal.        Behavior: Behavior normal.      ED Treatments / Results  Labs (all labs ordered are listed, but only abnormal results are displayed) Labs Reviewed - No data to display  EKG None  Radiology No results found.  Procedures Procedures (including critical care time)  Medications Ordered in ED Medications - No data to display   Initial Impression / Assessment and  Plan / ED Course  I have reviewed the triage vital signs and the nursing notes.  Pertinent labs & imaging results that were available during my care of the patient were reviewed by me and considered in my medical decision making (see chart for details).  Patient presents after a few episodes of vomiting this morning, the last one seemed to have a few small areas of blood in it, since then he has not had any additional episodes of vomiting.  He denies any associated abdominal pain, diarrhea, bloody stools, fevers.  Reports he feels completely normal at this time.  He has normal vitals.  Abdominal exam is benign.  He reports he thinks he ate something that upset his stomach last night.  Suspect he may have gotten a slight Mallory-Weiss tear from multiple episodes of vomiting.  I am reassured that patient has had complete resolution of his symptoms without intervention.  Offered patient blood work but he reports he is feeling better and primarily came here per his coworkers recommendation.  He reports he has had similar symptoms in the past when he eats something that bothers his stomach.  I discussed strict return precautions with the patient.  Prescribed Pepcid to help with any esophageal irritation and Zofran for any additional nausea.  Patient expresses understanding and agreement with plan.  Discharged home in good condition  Final Clinical Impressions(s) / ED Diagnoses   Final diagnoses:  Non-intractable vomiting with nausea, unspecified vomiting type    ED Discharge Orders         Ordered     ondansetron (ZOFRAN ODT) 4 MG disintegrating tablet     04/23/19 1107    famotidine (PEPCID) 20 MG tablet  2 times daily     04/23/19 1107           Dartha LodgeFord, Soul Hackman N, [place] JerseyPA-C 04/23/19 1332    Eber HongMiller, Brian, MD 04/24/19 [phone]

## 2019-04-23 NOTE — ED Triage Notes (Signed)
Pt here from work with c/o vomiting one time where he noticed a little pink tinge blood in it , his work wanted him to come to the ED to get checked out , no complaints at this time

## 2019-09-26 ENCOUNTER — Encounter (HOSPITAL_BASED_OUTPATIENT_CLINIC_OR_DEPARTMENT_OTHER): Payer: Self-pay | Admitting: *Deleted

## 2019-09-26 ENCOUNTER — Other Ambulatory Visit: Payer: Self-pay

## 2019-09-26 ENCOUNTER — Emergency Department (HOSPITAL_BASED_OUTPATIENT_CLINIC_OR_DEPARTMENT_OTHER)
Admission: EM | Admit: 2019-09-26 | Discharge: 2019-09-26 | Disposition: A | Discharge status: Home / Self Care | Payer: Self-pay | Attending: Emergency Medicine | Admitting: Emergency Medicine

## 2019-09-26 ENCOUNTER — Emergency Department (HOSPITAL_BASED_OUTPATIENT_CLINIC_OR_DEPARTMENT_OTHER): Payer: Self-pay

## 2019-09-26 DIAGNOSIS — Y939 Activity, unspecified: Secondary | ICD-10-CM | POA: Insufficient documentation

## 2019-09-26 DIAGNOSIS — Y9259 Other trade areas as the place of occurrence of the external cause: Secondary | ICD-10-CM | POA: Insufficient documentation

## 2019-09-26 DIAGNOSIS — X500XXA Overexertion from strenuous movement or load, initial encounter: Secondary | ICD-10-CM | POA: Insufficient documentation

## 2019-09-26 DIAGNOSIS — S29012A Strain of muscle and tendon of back wall of thorax, initial encounter: Secondary | ICD-10-CM | POA: Insufficient documentation

## 2019-09-26 DIAGNOSIS — R52 Pain, unspecified: Secondary | ICD-10-CM

## 2019-09-26 DIAGNOSIS — Y990 Civilian activity done for income or pay: Secondary | ICD-10-CM | POA: Insufficient documentation

## 2019-09-26 DIAGNOSIS — J45909 Unspecified asthma, uncomplicated: Secondary | ICD-10-CM | POA: Insufficient documentation

## 2019-09-26 MED ORDER — METHOCARBAMOL 1000 MG/10ML IJ SOLN
500.0000 mg | Freq: Once | INTRAMUSCULAR | Status: AC
  Administered 2019-09-26: 12:00:00 500 mg via INTRAMUSCULAR

## 2019-09-26 MED ORDER — NAPROXEN 500 MG PO TABS: 500.0000 mg | ORAL_TABLET | Freq: Two times a day (BID) | ORAL | 0 refills | Status: DC

## 2019-09-26 MED ORDER — METHOCARBAMOL 1000 MG/10ML IJ SOLN
1000.0000 mg | Freq: Once | INTRAMUSCULAR | Status: DC
  Filled 2019-09-26: qty 10

## 2019-09-26 MED ORDER — CYCLOBENZAPRINE HCL 10 MG PO TABS: 10.0000 mg | ORAL_TABLET | Freq: Two times a day (BID) | ORAL | 0 refills | Status: DC | PRN

## 2019-09-26 MED ORDER — KETOROLAC TROMETHAMINE 60 MG/2ML IM SOLN
60.0000 mg | Freq: Once | INTRAMUSCULAR | Status: AC
  Administered 2019-09-26: 12:00:00 60 mg via INTRAMUSCULAR
  Filled 2019-09-26: qty 2

## 2019-09-26 NOTE — ED Provider Notes (Signed)
[facility] EMERGENCY DEPARTMENT Provider Note   CSN: [id number] Arrival date & time: 09/26/19  1038     History Chief Complaint  Patient presents with  . Back Pain    Alan Foley is a 23 y.o. male.  HPI Patient reports yesterday morning he lifted a very heavy tire at work.  He immediately got a severe pain in the middle of his back.  He reports this is upper back centrally.  He reports that he went home when he tried to rest.  By this morning, it was much worse and any forward movement twisting or bending was exquisitely painful.  He reports he told his boss he needed to come in and get checked at the hospital.  No weakness numbness or tingling to the arms or the legs.  No shortness of breath or difficulty breathing.  No history of significant back problems.    Past Medical History:  Diagnosis Date  . Asthma   . Enlarged heart     There are no problems to display for this patient.   Past Surgical History:  Procedure Laterality Date  . FINGER SURGERY         History reviewed. No pertinent family history.  Social History   Tobacco Use  . Smoking status: Never Smoker  . Smokeless tobacco: Never Used  Substance Use Topics  . Alcohol use: No    Comment: occasionally  . Drug use: Yes    Types: Marijuana    Comment: occasionally    Home Medications Prior to Admission medications   Not on File    Allergies    Patient has no known allergies.  Review of Systems   Review of Systems 10 Systems reviewed and are negative for acute change except as noted in the HPI.  Physical Exam Updated Vital Signs BP (!) 145/68   Pulse 83   Temp 97.9 F (36.6 C) (Oral)   Resp 16   Ht 5\' 9"  (1.753 m)   Wt 117.9 kg   SpO2 99%   BMI 38.40 kg/m   Physical Exam Constitutional:      Comments: Patient is alert and appropriate.  No respiratory distress he appears very uncomfortable.  HENT:     Head: Normocephalic.  Eyes:     Extraocular Movements:  Extraocular movements intact.     Conjunctiva/sclera: Conjunctivae normal.     Pupils: Pupils are equal, round, and reactive to light.  Cardiovascular:     Rate and Rhythm: Normal rate and regular rhythm.  Pulmonary:     Effort: Pulmonary effort is normal.     Breath sounds: Normal breath sounds.  Abdominal:     General: There is no distension.     Palpations: Abdomen is soft.     Tenderness: There is no abdominal tenderness. There is no guarding.  Musculoskeletal:     Cervical back: Neck supple.     Comments: Patient endorses tenderness to palpation from about T4-T12.  Pain is actually most intense on the paraspinous muscle bodies bilaterally.  Less intense centrally over the bony prominences.  He denies lower back pain to palpation he denies cervical pain to palpation no CVA tenderness.  Skin:    General: Skin is warm and dry.  Neurological:     General: No focal deficit present.     Mental Status: He is oriented to person, place, and time.     Motor: No weakness.     Coordination: Coordination normal.  Gait: Gait normal.     Comments: Patient has no upper extremity weakness.  Grip strengths are normal.  Push pull normal.  Patient can stand and ambulate.  No lower extremity weakness.  Sensation intact to light touch x4 extremities.  Psychiatric:        Mood and Affect: Mood normal.     ED Results / Procedures / Treatments   Labs (all labs ordered are listed, but only abnormal results are displayed) Labs Reviewed - No data to display  EKG None  Radiology No results found.  Procedures Procedures (including critical care time)  Medications Ordered in ED Medications - No data to display  ED Course  I have reviewed the triage vital signs and the nursing notes.  Pertinent labs & imaging results that were available during my care of the patient were reviewed by me and considered in my medical decision making (see chart for details).  Clinical Course as of Sep 26 1323    Fri Sep 26, 2019  1201 Patient updated on x-ray results.  He reports he has not yet had his Toradol and Robaxin shot.   [MP]    Clinical Course User Index [MP] Arby Barrette, MD   MDM Rules/Calculators/A&P                      After Toradol Robaxin, patient has significant pain relief.  He reports he feels much better.  Now he can get up off the stretcher easily and walk about the room.  He is even stretching his back some.  Findings consistent with musculoskeletal back strain.  He has no neurologic dysfunction.  Lower suspicion for disc herniation.  Pain is over a fairly long stretch from about T4-T12 in the paraspinous muscle bodies.  Return precautions reviewed.  Advised to take naproxen twice daily for the next 2 to 3 days, Flexeril if needed.  Warm moist heat.  Also counseled on return precautions of weakness numbness or tingling.  Advised to follow-up with PCP for recheck to determine if improvement is significant or any referral to physical therapy is indicated. Final Clinical Impression(s) / ED Diagnoses Final diagnoses:  Upper back strain, initial encounter    Rx / DC Orders ED Discharge Orders    None       Arby Barrette, MD 09/26/19 1331

## 2019-09-26 NOTE — ED Triage Notes (Signed)
Pt c/o back pain since yesterday r/t heavy lifting.

## 2019-09-26 NOTE — ED Notes (Signed)
ED MD made aware of pt decreased pain.

## 2019-09-26 NOTE — Discharge Instructions (Signed)
1.  For the next couple of days take naproxen 500 mg twice a day.  Use Flexeril for muscle relaxer if needed. 2.  You may apply warm moist heat to your back. 3.  After about 2 to 3 days, try gentle stretching. 4.  Make a follow-up appointment with your family doctor.  If symptoms are persisting you may need referral for physical therapy. 5.  Return to the emergency department if you get weakness numbness or tingling in your arms or your legs or severe pain not controlled by your medication.

## 2020-01-12 ENCOUNTER — Emergency Department (HOSPITAL_BASED_OUTPATIENT_CLINIC_OR_DEPARTMENT_OTHER): Payer: Self-pay

## 2020-01-12 ENCOUNTER — Encounter (HOSPITAL_BASED_OUTPATIENT_CLINIC_OR_DEPARTMENT_OTHER): Payer: Self-pay | Admitting: *Deleted

## 2020-01-12 ENCOUNTER — Emergency Department (HOSPITAL_BASED_OUTPATIENT_CLINIC_OR_DEPARTMENT_OTHER)
Admission: EM | Admit: 2020-01-12 | Discharge: 2020-01-12 | Disposition: A | Discharge status: Home / Self Care | Payer: Self-pay | Attending: Emergency Medicine | Admitting: Emergency Medicine

## 2020-01-12 ENCOUNTER — Other Ambulatory Visit: Payer: Self-pay

## 2020-01-12 DIAGNOSIS — R112 Nausea with vomiting, unspecified: Secondary | ICD-10-CM | POA: Insufficient documentation

## 2020-01-12 DIAGNOSIS — R1084 Generalized abdominal pain: Secondary | ICD-10-CM | POA: Insufficient documentation

## 2020-01-12 DIAGNOSIS — J45909 Unspecified asthma, uncomplicated: Secondary | ICD-10-CM | POA: Insufficient documentation

## 2020-01-12 DIAGNOSIS — R197 Diarrhea, unspecified: Secondary | ICD-10-CM | POA: Insufficient documentation

## 2020-01-12 DIAGNOSIS — Z791 Long term (current) use of non-steroidal anti-inflammatories (NSAID): Secondary | ICD-10-CM | POA: Insufficient documentation

## 2020-01-12 LAB — CBC
HCT: 43 % (ref 39.0–52.0)
Hemoglobin: 15 g/dL (ref 13.0–17.0)
MCH: 22.6 pg — ABNORMAL LOW (ref 26.0–34.0)
MCHC: 34.9 g/dL (ref 30.0–36.0)
MCV: 64.7 fL — ABNORMAL LOW (ref 80.0–100.0)
Platelets: 249 10*3/uL (ref 150–400)
RBC: 6.65 MIL/uL — ABNORMAL HIGH (ref 4.22–5.81)
RDW: 16.8 % — ABNORMAL HIGH (ref 11.5–15.5)
WBC: 10.4 10*3/uL (ref 4.0–10.5)
nRBC: 0 % (ref 0.0–0.2)

## 2020-01-12 LAB — COMPREHENSIVE METABOLIC PANEL
ALT: 40 U/L (ref 0–44)
AST: 23 U/L (ref 15–41)
Albumin: 4.6 g/dL (ref 3.5–5.0)
Alkaline Phosphatase: 67 U/L (ref 38–126)
Anion gap: 10 (ref 5–15)
BUN: 14 mg/dL (ref 6–20)
CO2: 20 mmol/L — ABNORMAL LOW (ref 22–32)
Calcium: 8.9 mg/dL (ref 8.9–10.3)
Chloride: 103 mmol/L (ref 98–111)
Creatinine, Ser: 1.35 mg/dL — ABNORMAL HIGH (ref 0.61–1.24)
GFR calc Af Amer: >60 mL/min (ref >60)
GFR calc non Af Amer: >60 mL/min (ref >60)
Glucose, Bld: 97 mg/dL (ref 70–99)
Potassium: 3.5 mmol/L (ref 3.5–5.1)
Sodium: 133 mmol/L — ABNORMAL LOW (ref 135–145)
Total Bilirubin: 1.2 mg/dL (ref 0.3–1.2)
Total Protein: 8 g/dL (ref 6.5–8.1)

## 2020-01-12 LAB — URINALYSIS, ROUTINE W REFLEX MICROSCOPIC
Bilirubin Urine: NEGATIVE
Glucose, UA: NEGATIVE mg/dL
Hgb urine dipstick: NEGATIVE
Ketones, ur: NEGATIVE mg/dL
Leukocytes,Ua: NEGATIVE
Nitrite: NEGATIVE
Protein, ur: NEGATIVE mg/dL
Specific Gravity, Urine: >1.03 — ABNORMAL HIGH (ref 1.005–1.030)
pH: 6 (ref 5.0–8.0)

## 2020-01-12 LAB — LIPASE, BLOOD: Lipase: 28 U/L (ref 11–51)

## 2020-01-12 MED ORDER — SODIUM CHLORIDE 0.9 % IV BOLUS
1000.0000 mL | Freq: Once | INTRAVENOUS | Status: AC
  Administered 2020-01-12: 1000 mL via INTRAVENOUS

## 2020-01-12 MED ORDER — SODIUM CHLORIDE 0.9% FLUSH
3.0000 mL | Freq: Once | INTRAVENOUS | Status: DC
  Filled 2020-01-12: qty 3

## 2020-01-12 MED ORDER — ONDANSETRON 4 MG PO TBDP: 4.0000 mg | ORAL_TABLET | Freq: Three times a day (TID) | ORAL | 0 refills | Status: DC | PRN

## 2020-01-12 MED ORDER — MORPHINE SULFATE (PF) 4 MG/ML IV SOLN: 4.0000 mg | Freq: Once | INTRAVENOUS | Status: DC

## 2020-01-12 MED ORDER — IOHEXOL 300 MG/ML  SOLN
100.0000 mL | Freq: Once | INTRAMUSCULAR | Status: AC | PRN
  Administered 2020-01-12: 100 mL via INTRAVENOUS

## 2020-01-12 MED ORDER — ONDANSETRON HCL 4 MG/2ML IJ SOLN
4.0000 mg | Freq: Once | INTRAMUSCULAR | Status: AC
  Administered 2020-01-12: 15:00:00 4 mg via INTRAVENOUS
  Filled 2020-01-12: qty 2

## 2020-01-12 NOTE — ED Notes (Signed)
Pt. Is eating crackers and drinking ginger ale. 

## 2020-01-12 NOTE — ED Triage Notes (Signed)
Abdominal pain since yesterday. Diarrhea last night.

## 2020-01-12 NOTE — ED Notes (Signed)
Pt given a urinal and asked for a specimen.  He said he could not go right now but I asked him to try anyway. He said he would.

## 2020-01-12 NOTE — ED Provider Notes (Signed)
[facility] EMERGENCY DEPARTMENT Provider Note   CSN: [id number] Arrival date & time: 01/12/20  1315    History Chief Complaint  Patient presents with  . Abdominal Pain    Alan Foley is a 24 y.o. male with past medical history for cannabis use who presents for evaluation of abdominal pain and diarrhea.  Patient began abdominal pain yesterday.  Generalized tenderness.  No emesis however has had some nausea.  Patient with multiple episodes of diarrhea without melena or bright red blood per rectum.  Nares antibiotic use or travel.  No prior history of diverticulosis.  No fever, chills, chest pain, shortness of breath, hemoptysis, dysuria.  Admits to daily cannabinol use.  No prior abdominal surgeries.  Last p.o. intake yesterday evening when he had a "normal" diet.  Denies aggravating relieving factors.   History obtained from patient and past medical record.  No interpretor was used.  HPI     Past Medical History:  Diagnosis Date  . Asthma   . Enlarged heart     There are no problems to display for this patient.   Past Surgical History:  Procedure Laterality Date  . FINGER SURGERY         No family history on file.  Social History   Tobacco Use  . Smoking status: Never Smoker  . Smokeless tobacco: Never Used  Substance Use Topics  . Alcohol use: No    Comment: occasionally  . Drug use: Yes    Types: Marijuana    Comment: occasionally    Home Medications Prior to Admission medications   Medication Sig Start Date End Date Taking? Authorizing Provider  cyclobenzaprine (FLEXERIL) 10 MG tablet Take 1 tablet (10 mg total) by mouth 2 (two) times daily as needed for muscle spasms. 09/26/19   Arby Barrette, MD  naproxen (NAPROSYN) 500 MG tablet Take 1 tablet (500 mg total) by mouth 2 (two) times daily. 09/26/19   Arby Barrette, MD  ondansetron (ZOFRAN ODT) 4 MG disintegrating tablet Take 1 tablet (4 mg total) by mouth every 8 (eight) hours as needed for  nausea or vomiting. 01/12/20   Careena Degraffenreid A, PA-C    Allergies    Patient has no known allergies.  Review of Systems   Review of Systems  Constitutional: Negative.   HENT: Negative.   Eyes: Negative.   Respiratory: Negative.   Cardiovascular: Negative.   Gastrointestinal: Positive for abdominal pain, diarrhea and nausea. Negative for abdominal distention, anal bleeding, blood in stool, constipation, rectal pain and vomiting.  Genitourinary: Negative.   Musculoskeletal: Negative.   Skin: Negative.   Neurological: Negative.   All other systems reviewed and are negative.   Physical Exam Updated Vital Signs BP 120/64 (BP Location: Right Arm)   Pulse 92   Temp 98.2 F (36.8 C) (Oral)   Resp 16   Ht 5\' 9"  (1.753 m)   Wt 113.4 kg   SpO2 96%   BMI 36.92 kg/m   Physical Exam Vitals and nursing note reviewed.  Constitutional:      General: He is not in acute distress.    Appearance: He is well-developed. He is not ill-appearing, toxic-appearing or diaphoretic.  HENT:     Head: Normocephalic and atraumatic.     Mouth/Throat:     Mouth: Mucous membranes are moist.  Eyes:     Pupils: Pupils are equal, round, and reactive to light.  Cardiovascular:     Rate and Rhythm: Normal rate and regular rhythm.  Heart sounds: Normal heart sounds.  Pulmonary:     Effort: Pulmonary effort is normal. No respiratory distress.     Breath sounds: Normal breath sounds.  Abdominal:     General: Bowel sounds are normal. There is no distension.     Palpations: Abdomen is soft.     Tenderness: There is generalized abdominal tenderness. There is no right CVA tenderness, left CVA tenderness, guarding or rebound. Negative signs include Murphy's sign and McBurney's sign.     Hernia: No hernia is present.  Musculoskeletal:        General: Normal range of motion.     Cervical back: Normal range of motion and neck supple.  Skin:    General: Skin is warm and dry.     Capillary Refill:  Capillary refill takes less than 2 seconds.  Neurological:     General: No focal deficit present.     Mental Status: He is alert.     ED Results / Procedures / Treatments   Labs (all labs ordered are listed, but only abnormal results are displayed) Labs Reviewed  COMPREHENSIVE METABOLIC PANEL - Abnormal; Notable for the following components:      Result Value   Sodium 133 (*)    CO2 20 (*)    Creatinine, Ser 1.35 (*)    All other components within normal limits  CBC - Abnormal; Notable for the following components:   RBC 6.65 (*)    MCV 64.7 (*)    MCH 22.6 (*)    RDW 16.8 (*)    All other components within normal limits  URINALYSIS, ROUTINE W REFLEX MICROSCOPIC - Abnormal; Notable for the following components:   Specific Gravity, Urine >1.030 (*)    All other components within normal limits  LIPASE, BLOOD    EKG None  Radiology CT Abdomen Pelvis W Contrast  Result Date: 01/12/2020 CLINICAL DATA:  Abdominal pain, diarrhea EXAM: CT ABDOMEN AND PELVIS WITH CONTRAST TECHNIQUE: Multidetector CT imaging of the abdomen and pelvis was performed using the standard protocol following bolus administration of intravenous contrast. CONTRAST:  165mL OMNIPAQUE IOHEXOL 300 MG/ML  SOLN COMPARISON:  None. FINDINGS: Lower chest: Lung bases are clear. Hepatobiliary: Liver is within normal limits. Gallbladder is unremarkable. No intrahepatic or extrahepatic ductal dilatation. Pancreas: Within normal limits. Spleen: Within normal limits. Adrenals/Urinary Tract: Adrenal glands are within normal limits. Kidneys are within normal limits.  No hydronephrosis. Bladder is within normal limits. Stomach/Bowel: Stomach is within normal limits. No evidence of bowel obstruction. Mildly prominent but nondilated loops of small bowel in the left mid abdomen (series 2/image 41), at least raising the possibility of small bowel enteritis, although without associated mesenteric stranding/fluid. Normal appendix (series  2/image 61). No colonic wall thickening or inflammatory changes. Vascular/Lymphatic: No evidence of abdominal aortic aneurysm. No suspicious abdominopelvic lymphadenopathy. Reproductive: Prostate is unremarkable. Other: No abdominopelvic ascites. Musculoskeletal: Visualized osseous structures are within normal limits. IMPRESSION: No evidence of bowel obstruction.  Normal appendix. Mildly prominent small bowel in the left mid abdomen at least raises the possibility of small bowel enteritis, although equivocal. Electronically Signed   By: Julian Hy M.D.   On: 01/12/2020 15:35    Procedures Procedures (including critical care time)  Medications Ordered in ED Medications  sodium chloride flush (NS) 0.9 % injection 3 mL (3 mLs Intravenous Not Given 01/12/20 1404)  morphine 4 MG/ML injection 4 mg (4 mg Intravenous Not Given 01/12/20 1457)  sodium chloride 0.9 % bolus 1,000 mL (0 mLs Intravenous  Stopped 01/12/20 1558)  ondansetron (ZOFRAN) injection 4 mg (4 mg Intravenous Given 01/12/20 1500)  iohexol (OMNIPAQUE) 300 MG/ML solution 100 mL (100 mLs Intravenous Contrast Given 01/12/20 1507)   ED Course  I have reviewed the triage vital signs and the nursing notes.  Pertinent labs & imaging results that were available during my care of the patient were reviewed by me and considered in my medical decision making (see chart for details).  24 year old male presents for evaluation of abdominal pain.  He is afebrile, nonseptic, non ill appearing.  No suspicious food intake, sick contacts.  No recent travel or antibiotic use.  Lungs clear.  Abdomen diffusely tender however no rebound or guarding.  Negative Murphy sign, McBurney point.  Appears clinically well-hydrated.  Plan on labs, imaging and reassess  Labs and imaging personally interpreted.: Urinalysis negative for infection CBC without leukocytosis Metabolic panel with mild hyponatremia to 133, creatinine 1.35 however there is to be consistent with prior  records which I reviewed in epic.  CTAP with enteritis.  Patient reassessed.  Sx Improved.  Tolerating p.o. intake.  Continue benign abdominal exam.  CT scan with possible enteritis.  Patient is nontoxic, nonseptic appearing, in no apparent distress.  Patient's pain and other symptoms adequately managed in emergency department.  Fluid bolus given.  Labs, imaging and vitals reviewed.  Patient does not meet the SIRS or Sepsis criteria.  On repeat exam patient does not have a surgical abdomin and there are no peritoneal signs.  No indication of appendicitis, bowel obstruction, bowel perforation, cholecystitis, diverticulitis.  Patient discharged home with symptomatic treatment and given strict instructions for follow-up with their primary care physician.  I have also discussed reasons to return immediately to the ER.  Patient expresses understanding and agrees with plan.      MDM Rules/Calculators/A&P                      Final Clinical Impression(s) / ED Diagnoses Final diagnoses:  Generalized abdominal pain  Nausea vomiting and diarrhea    Rx / DC Orders ED Discharge Orders         Ordered    ondansetron (ZOFRAN ODT) 4 MG disintegrating tablet  Every 8 hours PRN     01/12/20 1608           Timmey Lamba A, PA-C 01/12/20 1610    Pollyann Savoy, MD 01/12/20 1640

## 2020-01-12 NOTE — ED Notes (Signed)
ED Provider at bedside. 

## 2020-01-12 NOTE — Discharge Instructions (Signed)
A bland diet over the next couple days.  I have given you Zofran which is an antinausea medicine.  You places on your tongue disintegrates.  You may also take Imodium at home.  Diarrhea.  Your symptoms should gradually improve.  I have written you out of work.  Return to emergency department for any worsening symptoms.

## 2020-01-12 NOTE — ED Notes (Addendum)
Pt. Reports he has had diarrhea since yesterday.  Pt. Also reports he did eat yesterday but has continues with the diarrhea and no vomiting.  Pt. In no distress.  Pt. Reports pain in the lower abd.

## 2020-09-16 ENCOUNTER — Other Ambulatory Visit: Payer: Self-pay

## 2020-09-16 ENCOUNTER — Encounter (HOSPITAL_COMMUNITY): Payer: Self-pay | Admitting: Emergency Medicine

## 2020-09-16 ENCOUNTER — Ambulatory Visit (HOSPITAL_COMMUNITY)
Admission: EM | Admit: 2020-09-16 | Discharge: 2020-09-16 | Disposition: A | Discharge status: Home / Self Care | Payer: Self-pay | Attending: Family Medicine | Admitting: Family Medicine

## 2020-09-16 DIAGNOSIS — Z202 Contact with and (suspected) exposure to infections with a predominantly sexual mode of transmission: Secondary | ICD-10-CM

## 2020-09-16 MED ORDER — METRONIDAZOLE 500 MG PO TABS
2000.0000 mg | ORAL_TABLET | Freq: Once | ORAL | Status: AC
  Administered 2020-09-16: 2000 mg via ORAL

## 2020-09-16 MED ORDER — METRONIDAZOLE 500 MG PO TABS
ORAL_TABLET | ORAL | Status: AC
  Filled 2020-09-16: qty 4

## 2020-09-16 NOTE — ED Provider Notes (Signed)
  [facility]   [id number] 09/16/20 Arrival Time: 1542  ASSESSMENT & PLAN:  1. STD exposure    Declines urethral cytology.   Discharge Instructions      Meds ordered this encounter  Medications  . metroNIDAZOLE (FLAGYL) tablet 2,000 mg        Reviewed expectations re: course of current medical issues. Questions answered. Outlined signs and symptoms indicating need for more acute intervention. Patient verbalized understanding. After Visit Summary given.   SUBJECTIVE:  Alan Foley is a 24 y.o. male who reports partner tested + for trich. He reports no symptoms including penile discharge. Denies: urinary frequency, dysuria and gross hematuria. Afebrile. No abdominal or pelvic pain. No n/v. No rashes or lesions. Reports that he is sexually active with single male partner.   OBJECTIVE:  Vitals:   09/16/20 1729  BP: 119/81  Pulse: 79  Resp: 18  Temp: 98.1 F (36.7 C)  TempSrc: Oral  SpO2: 99%     General appearance: alert, cooperative, appears stated age and no distress Lungs: unlabored respirations; speaks full sentences without difficulty GU: deferred Skin: warm and dry Psychological: alert and cooperative; normal mood and affect.    Labs Reviewed - No data to display  No Known Allergies  Past Medical History:  Diagnosis Date  . Asthma   . Enlarged heart    No family history on file. Social History   Socioeconomic History  . Marital status: Single    Spouse name: Not on file  . Number of children: Not on file  . Years of education: Not on file  . Highest education level: Not on file  Occupational History  . Not on file  Tobacco Use  . Smoking status: Never Smoker  . Smokeless tobacco: Never Used  Vaping Use  . Vaping Use: Never used  Substance and Sexual Activity  . Alcohol use: No    Comment: occasionally  . Drug use: Yes    Types: Marijuana    Comment: occasionally  . Sexual activity: Not Currently  Other Topics  Concern  . Not on file  Social History Narrative  . Not on file   Social Determinants of Health   Financial Resource Strain: Not on file  Food Insecurity: Not on file  Transportation Needs: Not on file  Physical Activity: Not on file  Stress: Not on file  Social Connections: Not on file  Intimate Partner Violence: Not on file          Mardella Layman, MD 09/16/20 1752

## 2020-09-16 NOTE — Discharge Instructions (Signed)
Meds ordered this encounter  °Medications  ° metroNIDAZOLE (FLAGYL) tablet 2,000 mg  ° ° °

## 2020-09-16 NOTE — ED Triage Notes (Signed)
Partner is positive for trich.   Patient denies pain, discharge.

## 2021-06-29 ENCOUNTER — Other Ambulatory Visit: Payer: Self-pay

## 2021-06-29 ENCOUNTER — Ambulatory Visit (HOSPITAL_COMMUNITY)
Admission: EM | Admit: 2021-06-29 | Discharge: 2021-06-29 | Disposition: A | Discharge status: Home / Self Care | Payer: Self-pay | Attending: Emergency Medicine | Admitting: Emergency Medicine

## 2021-06-29 ENCOUNTER — Encounter (HOSPITAL_COMMUNITY): Payer: Self-pay

## 2021-06-29 DIAGNOSIS — M5432 Sciatica, left side: Secondary | ICD-10-CM

## 2021-06-29 DIAGNOSIS — T148XXA Other injury of unspecified body region, initial encounter: Secondary | ICD-10-CM

## 2021-06-29 MED ORDER — KETOROLAC TROMETHAMINE 30 MG/ML IJ SOLN
INTRAMUSCULAR | Status: AC
  Filled 2021-06-29: qty 1

## 2021-06-29 MED ORDER — METHYLPREDNISOLONE SODIUM SUCC 125 MG IJ SOLR
80.0000 mg | Freq: Once | INTRAMUSCULAR | Status: AC
  Administered 2021-06-29: 80 mg via INTRAMUSCULAR

## 2021-06-29 MED ORDER — METHYLPREDNISOLONE SODIUM SUCC 125 MG IJ SOLR
INTRAMUSCULAR | Status: AC
  Filled 2021-06-29: qty 2

## 2021-06-29 MED ORDER — PREDNISONE 10 MG (21) PO TBPK: ORAL_TABLET | Freq: Every day | ORAL | 0 refills | Status: DC

## 2021-06-29 MED ORDER — KETOROLAC TROMETHAMINE 30 MG/ML IJ SOLN
30.0000 mg | Freq: Once | INTRAMUSCULAR | Status: AC
  Administered 2021-06-29: 30 mg via INTRAMUSCULAR

## 2021-06-29 MED ORDER — METHOCARBAMOL 500 MG PO TABS: 500.0000 mg | ORAL_TABLET | Freq: Two times a day (BID) | ORAL | 0 refills | Status: DC

## 2021-06-29 NOTE — ED Provider Notes (Signed)
[facility]    CSN: [id number] Arrival date & time: 06/29/21  1411      History   Chief Complaint Chief Complaint  Patient presents with   Leg Pain    HPI Alan Foley is a 25 y.o. male.   Lt lower back and hip pain that radiates to lt leg intermit since yesterday. 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 Pt denies any urinary sx. No fever. Has not taken anything pta.    Past Medical History:  Diagnosis Date   Asthma    Enlarged heart     There are no problems to display for this patient.   Past Surgical History:  Procedure Laterality Date   FINGER SURGERY         Home Medications    Prior to Admission medications   Medication Sig Start Date End Date Taking? Authorizing Provider  methocarbamol (ROBAXIN) 500 MG tablet Take 1 tablet (500 mg total) by mouth 2 (two) times daily. 06/29/21  Yes Coralyn Mark, NP  predniSONE (STERAPRED UNI-PAK 21 TAB) 10 MG (21) TBPK tablet Take by mouth daily. Take 6 tabs by mouth daily  for 2 days, then 5 tabs for 2 days, then 4 tabs for 2 days, then 3 tabs for 2 days, 2 tabs for 2 days, then 1 tab by mouth daily for 2 days 06/29/21  Yes Coralyn Mark, NP    Family History Family History  Family history unknown: Yes    Social History Social History   Tobacco Use   Smoking status: Never   Smokeless tobacco: Never  Vaping Use   Vaping Use: Never used  Substance Use Topics   Alcohol use: No    Comment: occasionally   Drug use: Yes    Types: Marijuana    Comment: occasionally     Allergies   Patient has no known allergies.   Review of Systems Review of Systems  Constitutional:  Negative for fever.  Respiratory: Negative.    Cardiovascular: Negative.   Gastrointestinal: Negative.   Musculoskeletal:  Positive for myalgias.       Lt lower back pain that causes pain and tingling to lt leg with position change.   Neurological:  Negative for weakness.     Physical Exam Triage Vital Signs ED Triage Vitals  Enc Vitals Group     BP 06/29/21 1501 138/66     Pulse Rate 06/29/21 1501 81     Resp 06/29/21 1501 18     Temp 06/29/21 1501 99.5 F (37.5 C)     Temp Source 06/29/21 1501 Oral     SpO2 06/29/21 1501 100 %     Weight --      Height --      Head Circumference --      Peak Flow --      Pain Score 06/29/21 1500 10     Pain Loc --      Pain Edu? --      Excl. in GC? --    No data found.  Updated Vital Signs BP 138/66 (BP Location: Left Arm)   Pulse 81   Temp 99.5 F (37.5 C) (Oral)   Resp 18   SpO2 100%   Visual Acuity Right Eye Distance:   Left Eye Distance:   Bilateral Distance:    Right Eye Near:   Left Eye Near:    Bilateral Near:     Physical Exam Constitutional:  Appearance: Normal appearance.  Cardiovascular:     Rate and Rhythm: Normal rate.  Pulmonary:     Effort: Pulmonary effort is normal.  Abdominal:     General: Abdomen is flat.  Musculoskeletal:        General: Tenderness present. No swelling.     Cervical back: Normal range of motion.     Left lower leg: No edema.     Comments: Pain to lt lower back, decreased rom to flexion   Skin:    General: Skin is warm.     Capillary Refill: Capillary refill takes less than 2 seconds.  Neurological:     General: No focal deficit present.     Mental Status: He is alert.     UC Treatments / Results  Labs (all labs ordered are listed, but only abnormal results are displayed) Labs Reviewed - No data to display  EKG   Radiology No results found.  Procedures Procedures (including critical care time)  Medications Ordered in UC Medications  ketorolac (TORADOL) 30 MG/ML injection 30 mg (has no administration in time range)  methylPREDNISolone sodium succinate (SOLU-MEDROL) 125 mg/2 mL injection 80 mg (has no administration in time range)    Initial Impression / Assessment and Plan / UC Course  I have reviewed the triage vital signs  and the nursing notes.  Pertinent labs & imaging results that were available during my care of the patient were reviewed by me and considered in my medical decision making (see chart for details).     Use heat to area  Rest for the next 48 hours  If symptoms persist call ortho for follow up test  May use motrin or tylenol as needed for pain  Do not drive while taking muscle relaxer  Final Clinical Impressions(s) / UC Diagnoses   Final diagnoses:  Muscle strain  Sciatica of left side     Discharge Instructions      Use heat to area  Rest for the next 48 hours  If symptoms persist call ortho for follow up test  May use motrin or tylenol as needed for pain  Do not drive while taking muscle relaxer      ED Prescriptions     Medication Sig Dispense Auth. Provider   predniSONE (STERAPRED UNI-PAK 21 TAB) 10 MG (21) TBPK tablet Take by mouth daily. Take 6 tabs by mouth daily  for 2 days, then 5 tabs for 2 days, then 4 tabs for 2 days, then 3 tabs for 2 days, 2 tabs for 2 days, then 1 tab by mouth daily for 2 days 42 tablet Maple Mirza L, NP   methocarbamol (ROBAXIN) 500 MG tablet Take 1 tablet (500 mg total) by mouth 2 (two) times daily. 20 tablet Coralyn Mark, NP      PDMP not reviewed this encounter.   Coralyn Mark, NP 06/29/21 1539

## 2021-06-29 NOTE — ED Triage Notes (Signed)
Pt presents with Left side leg pain from hip down leg that started this morning.

## 2021-06-29 NOTE — Discharge Instructions (Signed)
Use heat to area  Rest for the next 48 hours  If symptoms persist call ortho for follow up test  May use motrin or tylenol as needed for pain  Do not drive while taking muscle relaxer

## 2021-09-12 ENCOUNTER — Emergency Department (HOSPITAL_BASED_OUTPATIENT_CLINIC_OR_DEPARTMENT_OTHER): Payer: Self-pay

## 2021-09-12 ENCOUNTER — Encounter (HOSPITAL_BASED_OUTPATIENT_CLINIC_OR_DEPARTMENT_OTHER): Payer: Self-pay | Admitting: *Deleted

## 2021-09-12 ENCOUNTER — Other Ambulatory Visit: Payer: Self-pay

## 2021-09-12 DIAGNOSIS — M25561 Pain in right knee: Secondary | ICD-10-CM | POA: Insufficient documentation

## 2021-09-12 DIAGNOSIS — Y990 Civilian activity done for income or pay: Secondary | ICD-10-CM | POA: Insufficient documentation

## 2021-09-12 DIAGNOSIS — J45909 Unspecified asthma, uncomplicated: Secondary | ICD-10-CM | POA: Insufficient documentation

## 2021-09-12 DIAGNOSIS — Y9289 Other specified places as the place of occurrence of the external cause: Secondary | ICD-10-CM | POA: Insufficient documentation

## 2021-09-12 DIAGNOSIS — Y93F2 Activity, caregiving, lifting: Secondary | ICD-10-CM | POA: Insufficient documentation

## 2021-09-12 DIAGNOSIS — X501XXA Overexertion from prolonged static or awkward postures, initial encounter: Secondary | ICD-10-CM | POA: Insufficient documentation

## 2021-09-12 NOTE — ED Triage Notes (Signed)
C/o right knee twisting injury x 4 days ago

## 2021-09-13 ENCOUNTER — Emergency Department (HOSPITAL_BASED_OUTPATIENT_CLINIC_OR_DEPARTMENT_OTHER)
Admission: EM | Admit: 2021-09-13 | Discharge: 2021-09-13 | Disposition: A | Discharge status: Home / Self Care | Payer: Self-pay | Attending: Emergency Medicine | Admitting: Emergency Medicine

## 2021-09-13 DIAGNOSIS — M25561 Pain in right knee: Secondary | ICD-10-CM

## 2021-09-13 NOTE — ED Provider Notes (Signed)
[facility] EMERGENCY DEPARTMENT Provider Note   CSN: [id number] Arrival date & time: 09/12/21  2155     History Chief Complaint  Patient presents with   Knee Injury    Alan Foley is a 25 y.o. male.  The history is provided by the patient and a significant other.  Knee Pain Location:  Knee Time since incident:  4 days Injury: yes   Mechanism of injury comment:  Twisting Knee location:  R knee Pain details:    Quality:  Aching   Radiates to:  Does not radiate   Severity:  Moderate   Onset quality:  Gradual   Timing:  Constant   Progression:  Unchanged Relieved by:  Rest Worsened by:  Activity and bearing weight Associated symptoms: no fever   Patient reports he twisted his right knee about 4 days ago at work while doing lifting. No direct trauma.  Since that time it hurts to bend it or walk.  He reports that the swelling.  No other injuries    Past Medical History:  Diagnosis Date   Asthma    Enlarged heart     There are no problems to display for this patient.   Past Surgical History:  Procedure Laterality Date   FINGER SURGERY         Family History  Family history unknown: Yes    Social History   Tobacco Use   Smoking status: Never   Smokeless tobacco: Never  Vaping Use   Vaping Use: Never used  Substance Use Topics   Alcohol use: No    Comment: occasionally   Drug use: Yes    Types: Marijuana    Comment: occasionally    Home Medications Prior to Admission medications   Not on File    Allergies    Patient has no known allergies.  Review of Systems   Review of Systems  Constitutional:  Negative for fever.  Musculoskeletal:  Positive for arthralgias and joint swelling.   Physical Exam Updated Vital Signs BP 120/90 (BP Location: Right Arm)   Pulse 85   Temp 98.2 F (36.8 C) (Oral)   Resp 17   Ht 1.753 m (5\' 9" )   Wt 117.9 kg   SpO2 99%   BMI 38.40 kg/m   Physical Exam CONSTITUTIONAL: Well developed/well  nourished HEAD: Normocephalic/atraumatic EYES: EOMI ENMT: Mucous membranes moist NECK: supple no meningeal signs LUNGS: no distress NEURO: Pt is awake/alert/appropriate, moves all extremitiesx4.    EXTREMITIES: pulses normal/equal, full ROM Distal pulses equal and intact.  No calf tenderness.  Mild tenderness noted to right knee.  No erythema.  No deformities.  Effusion is noted to right knee SKIN: warm, color normal PSYCH: no abnormalities of mood noted, alert and oriented to situation  ED Results / Procedures / Treatments   Labs (all labs ordered are listed, but only abnormal results are displayed) Labs Reviewed - No data to display  EKG None  Radiology DG Knee Complete 4 Views Right  Result Date: 09/12/2021 CLINICAL DATA:  Right knee pain.  Twisting injury. EXAM: RIGHT KNEE - COMPLETE 4+ VIEW COMPARISON:  None. FINDINGS: No acute fracture or dislocation. The bones are well mineralized. Faint density in the medial compartment may represent minimal meniscal chondrocalcinosis. MRI may provide better evaluation if there is clinical concern for meniscal injury. There is a moderate size suprapatellar effusion. The soft tissues are unremarkable. IMPRESSION: 1. No acute fracture or dislocation. 2. Moderate suprapatellar effusion. 3. Possible minimal meniscal  chondrocalcinosis. Electronically Signed   By: Elgie Collard M.D.   On: 09/12/2021 22:25    Procedures Procedures   Medications Ordered in ED Medications - No data to display  ED Course  I have reviewed the triage vital signs and the nursing notes.  Pertinent imaging results that were available during my care of the patient were reviewed by me and considered in my medical decision making (see chart for details).    MDM Rules/Calculators/A&P                           Suspect patient has internal injury, possible meniscal injury No signs of septic joint.  Will defer arthrocentesis for now  will place in crutches, recommend  rest and elevation, refer to sports medicine Final Clinical Impression(s) / ED Diagnoses Final diagnoses:  Acute pain of right knee    Rx / DC Orders ED Discharge Orders     None        Zadie Rhine, MD 09/13/21 [phone]

## 2023-03-06 ENCOUNTER — Other Ambulatory Visit: Payer: Self-pay

## 2023-03-06 ENCOUNTER — Emergency Department (HOSPITAL_COMMUNITY): Payer: Self-pay

## 2023-03-06 ENCOUNTER — Encounter (HOSPITAL_COMMUNITY): Payer: Self-pay | Admitting: Emergency Medicine

## 2023-03-06 ENCOUNTER — Emergency Department (HOSPITAL_COMMUNITY)
Admission: EM | Admit: 2023-03-06 | Discharge: 2023-03-06 | Disposition: A | Discharge status: Home / Self Care | Payer: Self-pay | Attending: Emergency Medicine | Admitting: Emergency Medicine

## 2023-03-06 DIAGNOSIS — R079 Chest pain, unspecified: Secondary | ICD-10-CM | POA: Insufficient documentation

## 2023-03-06 DIAGNOSIS — F439 Reaction to severe stress, unspecified: Secondary | ICD-10-CM | POA: Insufficient documentation

## 2023-03-06 DIAGNOSIS — R030 Elevated blood-pressure reading, without diagnosis of hypertension: Secondary | ICD-10-CM | POA: Insufficient documentation

## 2023-03-06 LAB — CBC
HCT: 36.3 % — ABNORMAL LOW (ref 39.0–52.0)
Hemoglobin: 12.9 g/dL — ABNORMAL LOW (ref 13.0–17.0)
MCH: 23.2 pg — ABNORMAL LOW (ref 26.0–34.0)
MCHC: 35.5 g/dL (ref 30.0–36.0)
MCV: 65.3 fL — ABNORMAL LOW (ref 80.0–100.0)
Platelets: 228 10*3/uL (ref 150–400)
RBC: 5.56 MIL/uL (ref 4.22–5.81)
RDW: 15.3 % (ref 11.5–15.5)
WBC: 11.3 10*3/uL — ABNORMAL HIGH (ref 4.0–10.5)
nRBC: 0.2 % (ref 0.0–0.2)

## 2023-03-06 LAB — BASIC METABOLIC PANEL
Anion gap: 8 (ref 5–15)
BUN: 12 mg/dL (ref 6–20)
CO2: 25 mmol/L (ref 22–32)
Calcium: 8 mg/dL — ABNORMAL LOW (ref 8.9–10.3)
Chloride: 105 mmol/L (ref 98–111)
Creatinine, Ser: 1.33 mg/dL — ABNORMAL HIGH (ref 0.61–1.24)
GFR, Estimated: >60 mL/min (ref >60)
Glucose, Bld: 101 mg/dL — ABNORMAL HIGH (ref 70–99)
Potassium: 3.4 mmol/L — ABNORMAL LOW (ref 3.5–5.1)
Sodium: 138 mmol/L (ref 135–145)

## 2023-03-06 LAB — TROPONIN I (HIGH SENSITIVITY)
Troponin I (High Sensitivity): 4 ng/L (ref <18)
Troponin I (High Sensitivity): 5 ng/L (ref <18)

## 2023-03-06 NOTE — ED Provider Notes (Signed)
[place] EMERGENCY DEPARTMENT AT [facility] Provider Note   CSN: [id number] Arrival date & time: 03/06/23  0135     History  Chief Complaint  Patient presents with   Chest Pain   Headache    Alan Foley is a 27 y.o. male.  Patient presents to the emergency department with complaints of headache and chest pain.  Patient reports that he has been experiencing intermittent chest pain for 2 weeks and intermittent headache for 1 week.  Symptoms are brought on by stress at work.  He reports that he works every day, 7 AM to 9 PM, gets no rest.  No known heart disease.       Home Medications Prior to Admission medications   Not on File      Allergies    Patient has no known allergies.    Review of Systems   Review of Systems  Physical Exam Updated Vital Signs BP 130/77   Pulse 71   Temp 99 F (37.2 C)   Resp (!) 23   Wt 117.9 kg   SpO2 98%   BMI 38.38 kg/m  Physical Exam Vitals and nursing note reviewed.  Constitutional:      General: He is not in acute distress.    Appearance: He is well-developed.  HENT:     Head: Normocephalic and atraumatic.     Mouth/Throat:     Mouth: Mucous membranes are moist.  Eyes:     General: Vision grossly intact. Gaze aligned appropriately.     Extraocular Movements: Extraocular movements intact.     Conjunctiva/sclera: Conjunctivae normal.  Cardiovascular:     Rate and Rhythm: Normal rate and regular rhythm.     Pulses: Normal pulses.     Heart sounds: Normal heart sounds, S1 normal and S2 normal. No murmur heard.    No friction rub. No gallop.  Pulmonary:     Effort: Pulmonary effort is normal. No respiratory distress.     Breath sounds: Normal breath sounds.  Abdominal:     Palpations: Abdomen is soft.     Tenderness: There is no abdominal tenderness. There is no guarding or rebound.     Hernia: No hernia is present.  Musculoskeletal:        General: No swelling.     Cervical back: Full passive range of  motion without pain, normal range of motion and neck supple. No pain with movement, spinous process tenderness or muscular tenderness. Normal range of motion.     Right lower leg: No edema.     Left lower leg: No edema.  Skin:    General: Skin is warm and dry.     Capillary Refill: Capillary refill takes less than 2 seconds.     Findings: No ecchymosis, erythema, lesion or wound.  Neurological:     Mental Status: He is alert and oriented to person, place, and time.     GCS: GCS eye subscore is 4. GCS verbal subscore is 5. GCS motor subscore is 6.     Cranial Nerves: Cranial nerves 2-12 are intact.     Sensory: Sensation is intact.     Motor: Motor function is intact. No weakness or abnormal muscle tone.     Coordination: Coordination is intact.  Psychiatric:        Mood and Affect: Mood normal.        Speech: Speech normal.        Behavior: Behavior normal.     ED  Results / Procedures / Treatments   Labs (all labs ordered are listed, but only abnormal results are displayed) Labs Reviewed  BASIC METABOLIC PANEL - Abnormal; Notable for the following components:      Result Value   Potassium 3.4 (*)    Glucose, Bld 101 (*)    Creatinine, Ser 1.33 (*)    Calcium 8.0 (*)    All other components within normal limits  CBC - Abnormal; Notable for the following components:   WBC 11.3 (*)    Hemoglobin 12.9 (*)    HCT 36.3 (*)    MCV 65.3 (*)    MCH 23.2 (*)    All other components within normal limits  TROPONIN I (HIGH SENSITIVITY)  TROPONIN I (HIGH SENSITIVITY)    EKG EKG Interpretation  Date/Time:  Tuesday Mar 06 2023 01:32:03 EDT Ventricular Rate:  77 PR Interval:  148 QRS Duration: 96 QT Interval:  362 QTC Calculation: 409 R Axis:   43 Text Interpretation: Normal sinus rhythm Nonspecific ST and T wave abnormality poss LVH Abnormal ECG When compared with ECG of 28-Sep-2018 02:11, PREVIOUS ECG IS PRESENT Confirmed by Gilda Crease [phone]) on 03/06/2023 3:47:30  AM  Radiology DG Chest 2 View  Result Date: 03/06/2023 CLINICAL DATA:  Chest pain. EXAM: CHEST - 2 VIEW COMPARISON:  PA Lat 09/28/2018 FINDINGS: The heart size and mediastinal contours are within normal limits. Both lungs are hypoinflated but generally appear clear. The visualized skeletal structures are unremarkable. IMPRESSION: Hypoinflation. No active cardiopulmonary disease. Electronically Signed   By: Almira Bar M.D.   On: 03/06/2023 02:02    Procedures Procedures    Medications Ordered in ED Medications - No data to display  ED Course/ Medical Decision Making/ A&P                             Medical Decision Making Amount and/or Complexity of Data Reviewed Labs: ordered. Radiology: ordered.   Differential Diagnosis considered includes, but not limited to: STEMI; NSTEMI; myocarditis; pericarditis; pulmonary embolism; aortic dissection; pneumothorax; pneumonia; gastritis; musculoskeletal pain  Presents to the emergency department for evaluation of chest pain and headache.  Patient reports that he has been under a lot of stress at work and these pains generally occur when he is at work.  Blood pressure is elevated here in the ED but he does not report a history of hypertension.  EKG suspicious for borderline LVH with some repolarization changes, no obvious ischemia.  Troponin negative x 2.  Headache is unremarkable at this time.  Not the worst headache of his life, no red flags.  He has a normal neurologic exam.  This is consistent with the stress that he is describing.          Final Clinical Impression(s) / ED Diagnoses Final diagnoses:  Chest pain, unspecified type  Stress    Rx / DC Orders ED Discharge Orders     None         Gilda Crease, MD 03/06/23 [phone]

## 2023-03-06 NOTE — ED Triage Notes (Signed)
Pt in with sharp L chest pain x 2 wks, along with HA x 1 wk. Pt states pain became unbearable tonight. Denies any sob, dizziness or pain radiation

## 2023-07-09 IMAGING — DX DG KNEE COMPLETE 4+V*R*
4 series · 4 of 4 positions shown · non-contrast
Comparison: None.

CLINICAL DATA: Right knee pain.  Twisting injury.

EXAM:
RIGHT KNEE - COMPLETE 4+ VIEW

[knee ap]
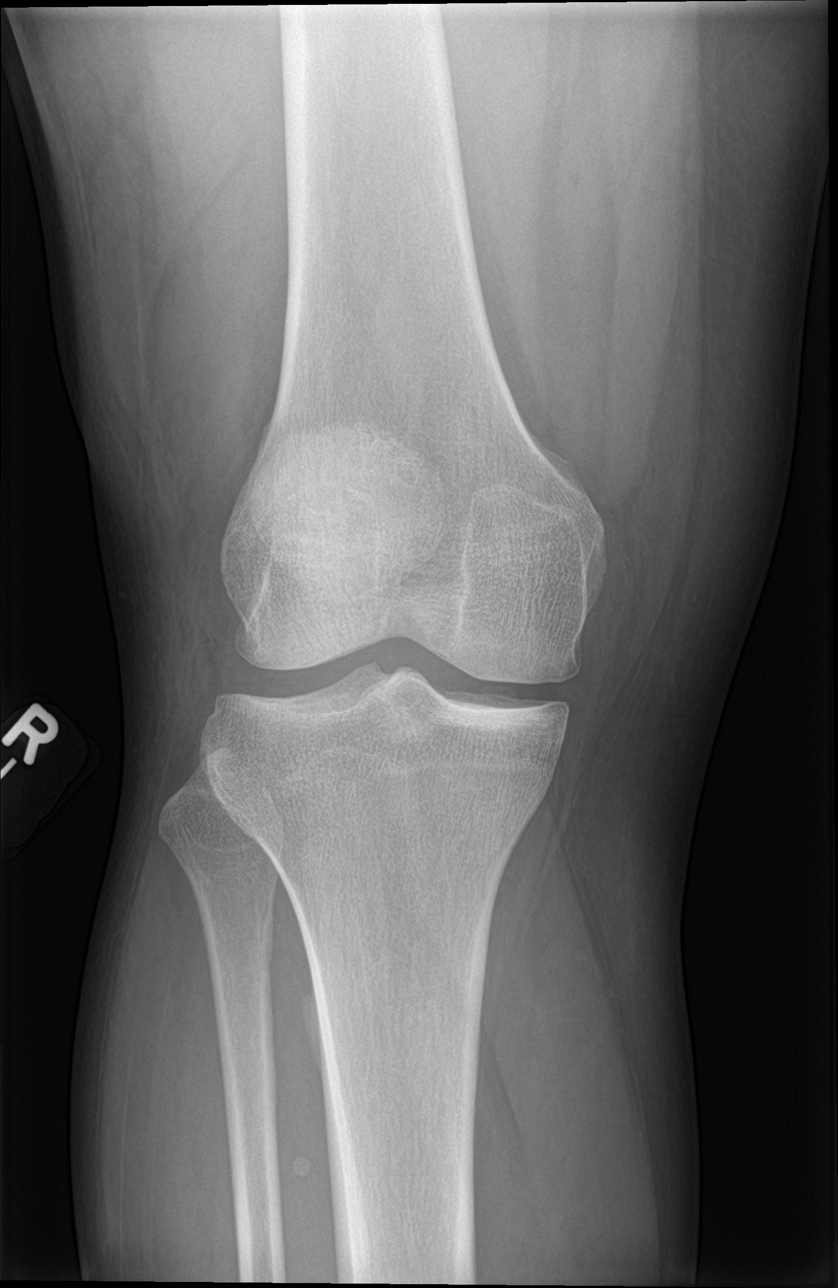

[knee lat]
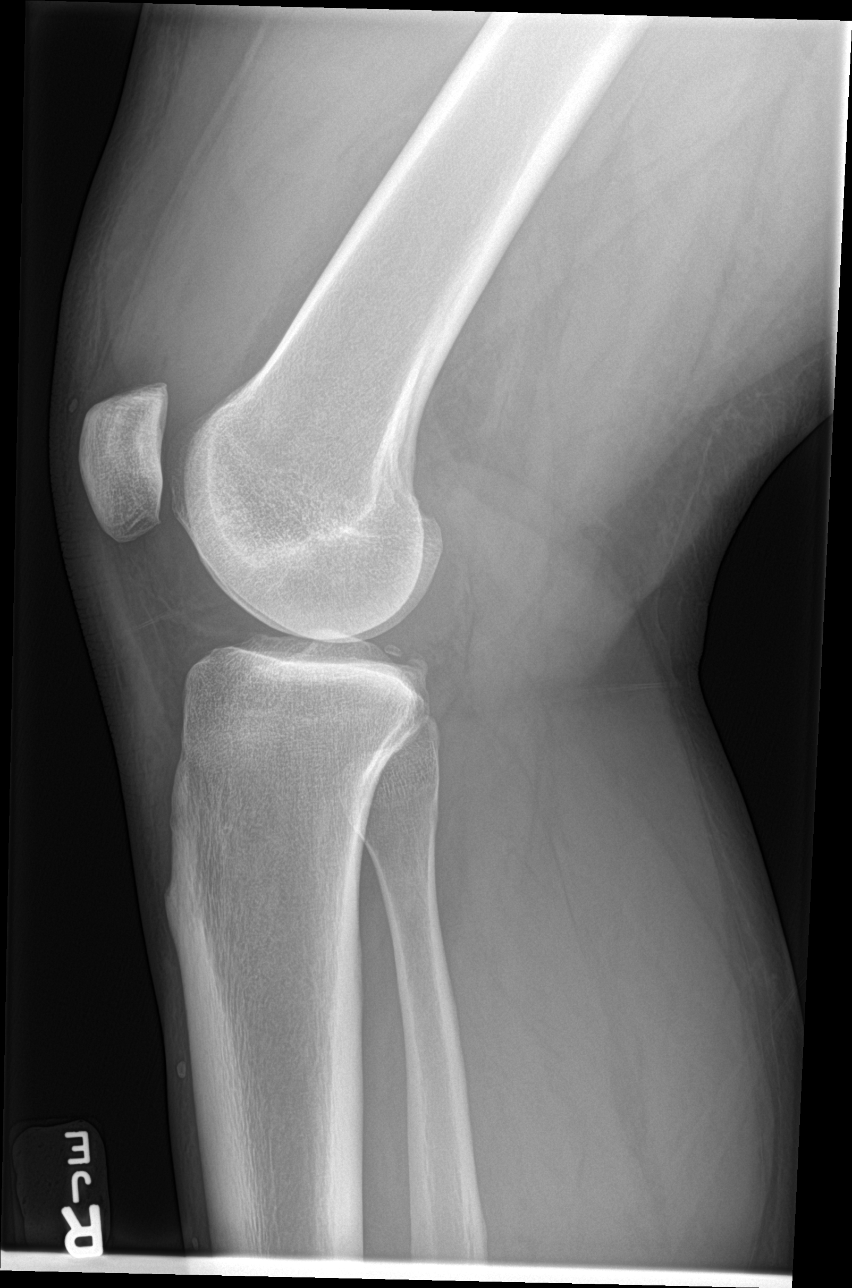

[knee obl (1 of 2)]
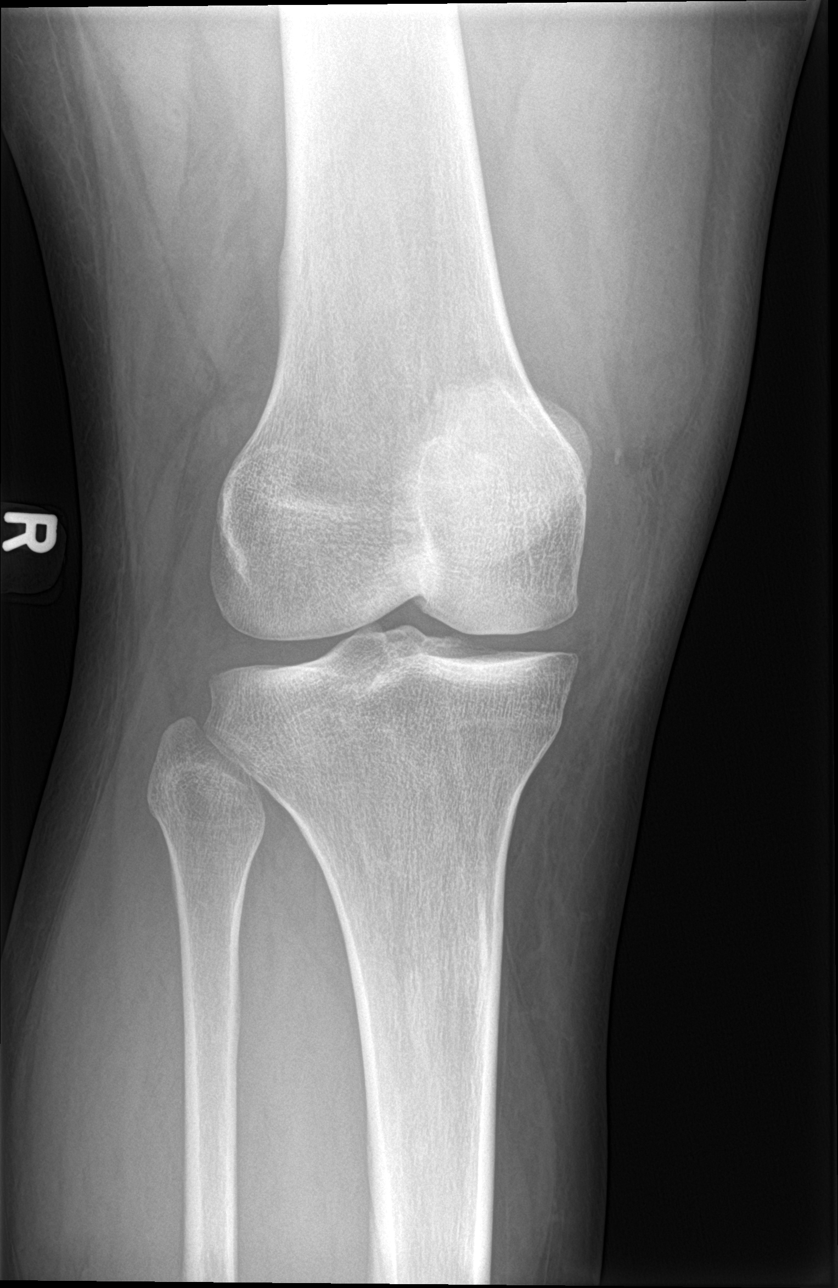

[knee obl (2 of 2)]
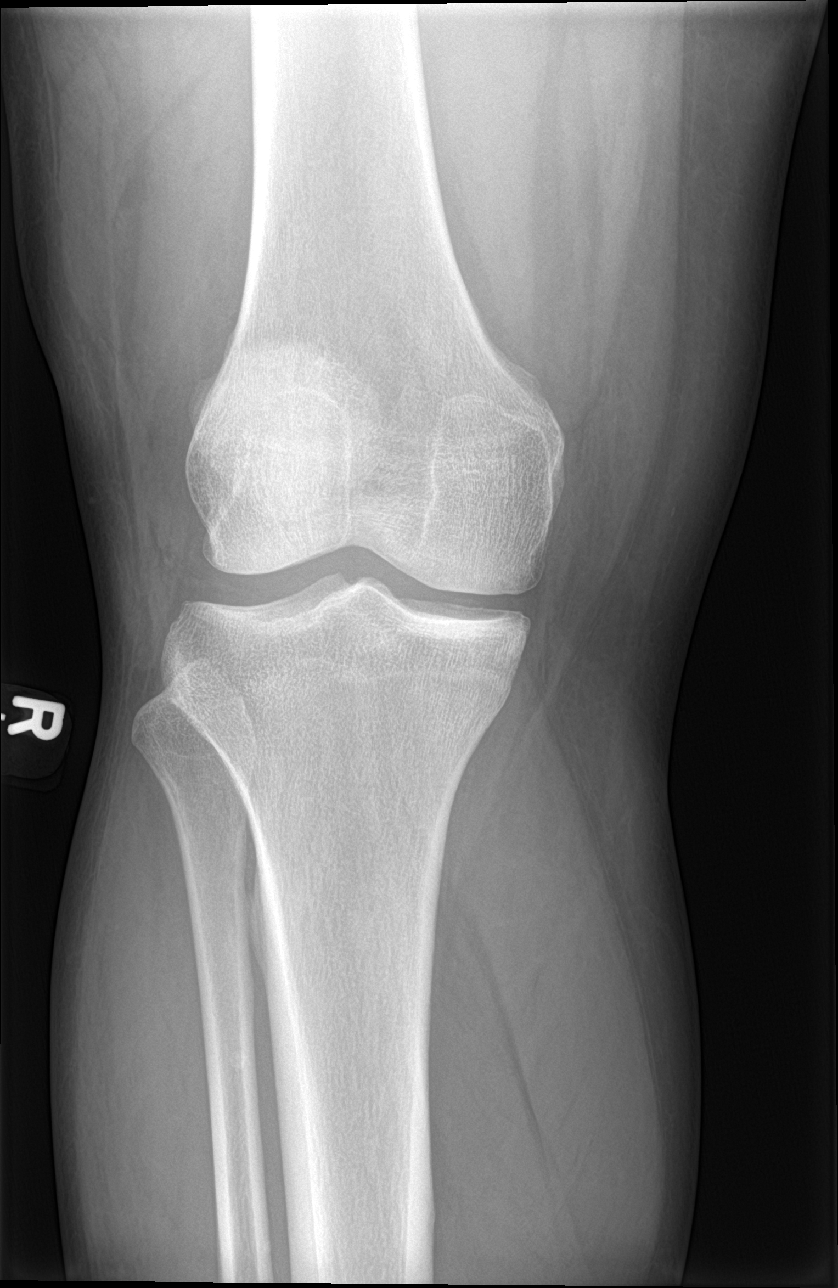

[4 of 4 positions shown; findings below may reference images not displayed]

FINDINGS: No acute fracture or dislocation. The bones are well mineralized.
Faint density in the medial compartment may represent minimal
meniscal chondrocalcinosis. MRI may provide better evaluation if
there is clinical concern for meniscal injury. There is a moderate
size suprapatellar effusion. The soft tissues are unremarkable.
IMPRESSION: 1. No acute fracture or dislocation.
2. Moderate suprapatellar effusion.
3. Possible minimal meniscal chondrocalcinosis.

## 2023-12-03 ENCOUNTER — Encounter (HOSPITAL_BASED_OUTPATIENT_CLINIC_OR_DEPARTMENT_OTHER): Payer: Self-pay | Admitting: Emergency Medicine

## 2023-12-03 ENCOUNTER — Other Ambulatory Visit: Payer: Self-pay

## 2023-12-03 ENCOUNTER — Emergency Department (HOSPITAL_BASED_OUTPATIENT_CLINIC_OR_DEPARTMENT_OTHER)
Admission: EM | Admit: 2023-12-03 | Discharge: 2023-12-03 | Disposition: A | Discharge status: Home / Self Care | Payer: Self-pay | Attending: Emergency Medicine | Admitting: Emergency Medicine

## 2023-12-03 DIAGNOSIS — J020 Streptococcal pharyngitis: Secondary | ICD-10-CM

## 2023-12-03 DIAGNOSIS — J45909 Unspecified asthma, uncomplicated: Secondary | ICD-10-CM | POA: Insufficient documentation

## 2023-12-03 DIAGNOSIS — J101 Influenza due to other identified influenza virus with other respiratory manifestations: Secondary | ICD-10-CM | POA: Insufficient documentation

## 2023-12-03 LAB — RESP PANEL BY RT-PCR (RSV, FLU A&B, COVID)  RVPGX2
Influenza A by PCR: POSITIVE — AB
Influenza B by PCR: NEGATIVE
Resp Syncytial Virus by PCR: NEGATIVE
SARS Coronavirus 2 by RT PCR: NEGATIVE

## 2023-12-03 LAB — GROUP A STREP BY PCR: Group A Strep by PCR: DETECTED — AB

## 2023-12-03 MED ORDER — ACETAMINOPHEN 325 MG PO TABS
650.0000 mg | ORAL_TABLET | Freq: Once | ORAL | Status: AC | PRN
  Administered 2023-12-03: 650 mg via ORAL
  Filled 2023-12-03: qty 2

## 2023-12-03 MED ORDER — PENICILLIN G BENZATHINE 1200000 UNIT/2ML IM SUSY
1.2000 10*6.[IU] | PREFILLED_SYRINGE | Freq: Once | INTRAMUSCULAR | Status: AC
  Administered 2023-12-03: 1.2 10*6.[IU] via INTRAMUSCULAR
  Filled 2023-12-03: qty 2

## 2023-12-03 NOTE — Discharge Instructions (Signed)
 You have been seen today for your complaint of flu symptoms, sore throat. Your lab work was positive for RSV and influenza A. Your discharge medications include Alternate tylenol and ibuprofen for pain and fevers. You may alternate these every 4 hours. You may take up to 800 mg of ibuprofen at a time and up to 1000 mg of tylenol. Use Claritin and Flonase for congestion and runny nose. Use Delsym, Mucinex DM or Robitussin DM for cough. Follow up with: Your primary care provider Please seek immediate medical care if you develop any of the following symptoms: You become short of breath or have trouble breathing. Your skin or nails turn blue. You have very bad pain or stiffness in your neck. You get a sudden headache or pain in your face or ear. You vomit each time you eat or drink. At this time there does not appear to be the presence of an emergent medical condition, however there is always the potential for conditions to change. Please read and follow the below instructions.  Do not take your medicine if  develop an itchy rash, swelling in your mouth or lips, or difficulty breathing; call 911 and seek immediate emergency medical attention if this occurs.  You may review your lab tests and imaging results in their entirety on your MyChart account.  Please discuss all results of fully with your primary care provider and other specialist at your follow-up visit.  Note: Portions of this text may have been transcribed using voice recognition software. Every effort was made to ensure accuracy; however, inadvertent computerized transcription errors may still be present.

## 2023-12-03 NOTE — ED Triage Notes (Signed)
 X2 days fatigue, chills, nasal congestion. No OTC meds. No med hx reported.

## 2023-12-03 NOTE — ED Notes (Signed)

## 2023-12-03 NOTE — ED Provider Notes (Signed)
 [place] EMERGENCY DEPARTMENT AT [facility] Provider Note   CSN: [id number] Arrival date & time: 12/03/23  1331     History  No chief complaint on file.   Alan Foley is a 28 y.o. male.  With a history of asthma presenting to the ED for evaluation of flulike symptoms including fatigue, chills, chills, congestion, rhinorrhea, diarrhea, sore throat.  Symptoms began 3 days ago.  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  His significant other and their children have been sick recently but their symptoms have been more mild.  Symptoms wax and wane in intensity.  He did not receive his flu vaccine this year.  HPI     Home Medications Prior to Admission medications   Not on File      Allergies    Patient has no known allergies.    Review of Systems   Review of Systems  Constitutional:  Positive for chills and fever.  HENT:  Positive for congestion, rhinorrhea and sore throat.   Respiratory:  Positive for cough.   Gastrointestinal:  Positive for diarrhea.  All other systems reviewed and are negative.   Physical Exam Updated Vital Signs BP 127/74   Pulse (!) 104   Temp (!) 100.4 F (38 C) (Oral)   Resp 18   SpO2 100%  Physical Exam Vitals and nursing note reviewed.  Constitutional:      General: He is not in acute distress.    Appearance: Normal appearance. He is normal weight. He is not ill-appearing.     Comments: Resting comfortably in chair  HENT:     Head: Normocephalic and atraumatic.     Nose: Congestion present.     Mouth/Throat:     Mouth: Mucous membranes are moist.     Pharynx: Oropharynx is clear.     Comments: Moderate posterior pharyngeal erythema.  Tonsils 1+ bilaterally.  Uvula midline.  No exudates. Cardiovascular:     Rate and Rhythm: Normal rate and regular rhythm.  Pulmonary:     Effort: Pulmonary effort is normal. No respiratory distress.     Breath sounds: No wheezing, rhonchi or rales.  Abdominal:      General: Abdomen is flat.  Musculoskeletal:        General: Normal range of motion.     Cervical back: Neck supple.  Skin:    General: Skin is warm and dry.  Neurological:     Mental Status: He is alert and oriented to person, place, and time.  Psychiatric:        Mood and Affect: Mood normal.        Behavior: Behavior normal.     ED Results / Procedures / Treatments   Labs (all labs ordered are listed, but only abnormal results are displayed) Labs Reviewed  RESP PANEL BY RT-PCR (RSV, FLU A&B, COVID)  RVPGX2 - Abnormal; Notable for the following components:      Result Value   Influenza A by PCR POSITIVE (*)    All other components within normal limits  GROUP A STREP BY PCR - Abnormal; Notable for the following components:   Group A Strep by PCR DETECTED (*)    All other components within normal limits    EKG None  Radiology No results found.  Procedures Procedures    Medications Ordered in ED Medications  acetaminophen (TYLENOL) tablet 650 mg (650 mg Oral Given 12/03/23 1420)  penicillin g benzathine (BICILLIN LA) 1200000 UNIT/2ML  injection 1.2 Million Units (1.2 Million Units Intramuscular Given 12/03/23 1618)    ED Course/ Medical Decision Making/ A&P                                 Medical Decision Making Risk OTC drugs.  This patient presents to the ED for concern of flulike symptoms, this involves an extensive number of treatment options, and is a complaint that carries with it a high risk of complications and morbidity.  The differential diagnosis includes flu, COVID, RSV, strep  My initial workup includes respiratory panel, rapid strep  Additional history obtained from: Nursing notes from this visit.  I ordered, reviewed and interpreted labs which include: Respiratory panel, rapid strep.  Influenza A positive.  Rapid strep positive.  Initially febrile and borderline tachycardic in the setting of left confirmed influenza A.  Otherwise hemodynamically  stable.  28 year old male presenting to the ED for evaluation of flulike symptoms.  Symptoms began yesterday.  Flu likely the source of symptoms.  He appears well on physical exam.  Also tested positive for strep by PCR.  Patient is outside the window for Tamiflu.  I had a shared decision-making conversation with the patient regarding strep treatment.  He prefers Bicillin injection.  This was ordered in the emergency department.  Patient was educated on typical timeline of symptoms and supportive care.  Work note was provided.  He was encouraged to follow-up with his primary care provider.  He was given return precautions.  Stable at discharge.  At this time there does not appear to be any evidence of an acute emergency medical condition and the patient appears stable for discharge with appropriate outpatient follow up. Diagnosis was discussed with patient who verbalizes understanding of care plan and is agreeable to discharge. I have discussed return precautions with patient and significant other who verbalizes understanding. Patient encouraged to follow-up with their PCP within 1 week. All questions answered.  Note: Portions of this report may have been transcribed using voice recognition software. Every effort was made to ensure accuracy; however, inadvertent computerized transcription errors may still be present.        Final Clinical Impression(s) / ED Diagnoses Final diagnoses:  Influenza A  Strep pharyngitis    Rx / DC Orders ED Discharge Orders     None         Michelle Piper, PA-C 12/03/23 1653    Rozelle Logan, DO 12/03/23 1715

## 2023-12-03 NOTE — ED Notes (Signed)
 Call for triage, no answer.

## 2023-12-06 ENCOUNTER — Telehealth: Payer: Self-pay | Admitting: Physician Assistant

## 2023-12-06 DIAGNOSIS — R0602 Shortness of breath: Secondary | ICD-10-CM

## 2023-12-06 NOTE — Progress Notes (Signed)
  Because of mention of shortness of breath and constant chest/abdominal pain with these symptoms, I feel your condition warrants further evaluation and I recommend that you be seen in a face-to-face visit.   NOTE: There will be NO CHARGE for this E-Visit   If you are having a true medical emergency, please call 911.     For an urgent face to face visit, [facility] has multiple urgent care centers for your convenience.  Click the link below for the full list of locations and hours, walk-in wait times, appointment scheduling options and driving directions:  Urgent Care - [place], [place], [place], [place], [place], [place]  [place]     Your MyChart E-visit questionnaire answers were reviewed by a board certified advanced clinical practitioner to complete your personal care plan based on your specific symptoms.    Thank you for using e-Visits.

## 2024-04-24 ENCOUNTER — Ambulatory Visit
Admission: RE | Admit: 2024-04-24 | Discharge: 2024-04-24 | Disposition: A | Discharge status: Home / Self Care | Payer: Self-pay | Source: Ambulatory Visit | Attending: Nurse Practitioner | Admitting: Nurse Practitioner

## 2024-04-24 VITALS — BP 112/78 | HR 89 | Temp 97.5°F | Resp 17

## 2024-04-24 DIAGNOSIS — K921 Melena: Secondary | ICD-10-CM

## 2024-04-24 DIAGNOSIS — A084 Viral intestinal infection, unspecified: Secondary | ICD-10-CM

## 2024-04-24 MED ORDER — ONDANSETRON 8 MG PO TBDP: 8.0000 mg | ORAL_TABLET | Freq: Three times a day (TID) | ORAL | 0 refills | Status: DC | PRN

## 2024-04-24 NOTE — Discharge Instructions (Addendum)
 You were evaluated today for diarrhea and mild rectal bleeding that began shortly after eating out. Your symptoms are consistent with viral gastroenteritis, which typically resolves on its own with supportive care. Focus on staying hydrated by drinking clear fluids such as water, electrolyte drinks, or diluted juices. Avoid beverages with high sugar or caffeine, such as soda or energy drinks, as these may worsen diarrhea. As your appetite returns, slowly reintroduce bland foods like rice, toast, applesauce, bananas, or plain crackers. Avoid spicy, fried, greasy, or dairy-containing foods until your symptoms have fully resolved.  Get plenty of rest and wash your hands thoroughly after using the restroom to prevent spreading the illness to others. You may return to work on Saturday if symptoms improve and you are feeling well.  Contact your primary care provider if your symptoms last more than a few more days, if diarrhea worsens or becomes bloody, or if you develop a fever or increasing abdominal pain.   Go to the emergency room if you experience signs of dehydration such as dizziness, dry mouth, reduced urination, or persistent vomiting, or if rectal bleeding becomes heavy or persistent.

## 2024-04-24 NOTE — ED Triage Notes (Signed)
 Pt present with diarrhea x three days and blood in stool x two days. States his stomach has started to feel funny. He has not had an appetite. States he has been drinking lots of water.

## 2024-04-24 NOTE — ED Provider Notes (Signed)
 [facility]    CSN: [id number] Arrival date & time: 04/24/24  1145      History   Chief Complaint Chief Complaint  Patient presents with   Blood In Stools    HPI Alan Foley is a 28 y.o. male.   Discussed the use of AI scribe software for clinical note transcription with the patient, who gave verbal consent to proceed.   The patient presents with a 3-day history of diarrhea and 2-day history of blood in stool. The patient reports that the diarrhea started on Monday morning upon waking. He noticed blood in their stool for the past 2 days, describing it as a small amount mixed in with the stool. The patient denies pain in the rectum when defecating but mentions their stomach feels funny without pain. He  reports a decreased appetite but have been drinking lots of water. Associated symptoms include feeling extremely weak, tired, and drained. The patient denies nausea, vomiting, fevers, chills, body aches, or headaches. He also denies any issues with urination, including blood in urine or pain while urinating. The patient reports eating out on Sunday, the day before symptom onset. He has not experienced this specific combination of symptoms before.  The following portions of the patient's history were reviewed and updated as appropriate: allergies, current medications, past family history, past medical history, past social history, past surgical history, and problem list.    Past Medical History:  Diagnosis Date   Asthma    Enlarged heart     There are no active problems to display for this patient.   Past Surgical History:  Procedure Laterality Date   FINGER SURGERY         Home Medications    Prior to Admission medications   Medication Sig Start Date End Date Taking? Authorizing Provider  ondansetron  (ZOFRAN -ODT) 8 MG disintegrating tablet Take 1 tablet (8 mg total) by mouth every 8 (eight) hours as needed for nausea or vomiting. 04/24/24  Yes Iola Lukes, FNP    Family History Family History  Family history unknown: Yes    Social History Social History   Tobacco Use   Smoking status: Never   Smokeless tobacco: Never  Vaping Use   Vaping status: Never Used  Substance Use Topics   Alcohol use: No    Comment: occasionally   Drug use: Yes    Types: Marijuana    Comment: occasionally     Allergies   Patient has no known allergies.   Review of Systems Review of Systems  Constitutional:  Positive for appetite change and fatigue. Negative for chills and fever.  Gastrointestinal:  Positive for blood in stool, diarrhea and nausea. Negative for abdominal pain, rectal pain and vomiting.  Genitourinary:  Negative for dysuria and hematuria.  Musculoskeletal:  Negative for myalgias.  Neurological:  Positive for weakness (generalized). Negative for dizziness and headaches.  All other systems reviewed and are negative.    Physical Exam Triage Vital Signs ED Triage Vitals  Encounter Vitals Group     BP 04/24/24 1153 112/78     Girls Systolic BP Percentile --      Girls Diastolic BP Percentile --      Boys Systolic BP Percentile --      Boys Diastolic BP Percentile --      Pulse Rate 04/24/24 1153 89     Resp 04/24/24 1153 17     Temp 04/24/24 1153 (!) 97.5 F (36.4 C)     Temp  Source 04/24/24 1153 Temporal     SpO2 04/24/24 1153 93 %     Weight --      Height --      Head Circumference --      Peak Flow --      Pain Score 04/24/24 1152 7     Pain Loc --      Pain Education --      Exclude from Growth Chart --    No data found.  Updated Vital Signs BP 112/78 (BP Location: Left Arm)   Pulse 89   Temp (!) 97.5 F (36.4 C) (Temporal)   Resp 17   SpO2 93%   Visual Acuity Right Eye Distance:   Left Eye Distance:   Bilateral Distance:    Right Eye Near:   Left Eye Near:    Bilateral Near:     Physical Exam Vitals reviewed.  Constitutional:      General: He is awake. He is not in acute distress.     Appearance: Normal appearance. He is well-developed. He is not ill-appearing, toxic-appearing or diaphoretic.  HENT:     Head: Normocephalic.     Right Ear: Hearing normal.     Left Ear: Hearing normal.     Nose: Nose normal.     Mouth/Throat:     Mouth: Mucous membranes are moist.  Eyes:     General: Vision grossly intact.     Conjunctiva/sclera: Conjunctivae normal.  Cardiovascular:     Rate and Rhythm: Normal rate and regular rhythm.     Heart sounds: Normal heart sounds.  Pulmonary:     Effort: Pulmonary effort is normal.     Breath sounds: Normal breath sounds and air entry.  Musculoskeletal:        General: Normal range of motion.     Cervical back: Full passive range of motion without pain, normal range of motion and neck supple.  Skin:    General: Skin is warm and dry.  Neurological:     General: No focal deficit present.     Mental Status: He is alert and oriented to person, place, and time.  Psychiatric:        Speech: Speech normal.        Behavior: Behavior is cooperative.      UC Treatments / Results  Labs (all labs ordered are listed, but only abnormal results are displayed) Labs Reviewed - No data to display  EKG   Radiology No results found.  Procedures Procedures (including critical care time)  Medications Ordered in UC Medications - No data to display  Initial Impression / Assessment and Plan / UC Course  I have reviewed the triage vital signs and the nursing notes.  Pertinent labs & imaging results that were available during my care of the patient were reviewed by me and considered in my medical decision making (see chart for details).     Patient presents with a 3-day history of diarrhea and a 2-day history of hematochezia, which began abruptly following a restaurant meal. She reports fatigue, decreased appetite, and mild gastrointestinal discomfort without fever, chills, or systemic symptoms. The presence of small amounts of blood is  likely due to rectal irritation from frequent bowel movements. Given the acute onset and history, the presentation is most consistent with viral gastroenteritis. Supportive care was recommended, including maintaining hydration with clear fluids, gradually resuming a bland diet, and avoiding irritating foods and beverages. Patient advised to rest and use good hand hygiene.  She may return to work Saturday if symptoms improve. Follow-up with PCP is recommended if symptoms persist beyond a few days, worsen, or if bleeding increases or becomes associated with abdominal pain or fever. ED evaluation is advised for signs of dehydration, persistent vomiting, severe abdominal pain, or significant rectal bleeding.  Today's evaluation has revealed no signs of a dangerous process. Discussed diagnosis with patient and/or guardian. Patient and/or guardian aware of their diagnosis, possible red flag symptoms to watch out for and need for close follow up. Patient and/or guardian understands verbal and written discharge instructions. Patient and/or guardian comfortable with plan and disposition.  Patient and/or guardian has a clear mental status at this time, good insight into illness (after discussion and teaching) and has clear judgment to make decisions regarding their care  Documentation was completed with the aid of voice recognition software. Transcription may contain typographical errors. Final Clinical Impressions(s) / UC Diagnoses   Final diagnoses:  Viral gastroenteritis  Hematochezia not due to hemorrhage from anus     Discharge Instructions      You were evaluated today for diarrhea and mild rectal bleeding that began shortly after eating out. Your symptoms are consistent with viral gastroenteritis, which typically resolves on its own with supportive care. Focus on staying hydrated by drinking clear fluids such as water, electrolyte drinks, or diluted juices. Avoid beverages with high sugar or caffeine,  such as soda or energy drinks, as these may worsen diarrhea. As your appetite returns, slowly reintroduce bland foods like rice, toast, applesauce, bananas, or plain crackers. Avoid spicy, fried, greasy, or dairy-containing foods until your symptoms have fully resolved.  Get plenty of rest and wash your hands thoroughly after using the restroom to prevent spreading the illness to others. You may return to work on Saturday if symptoms improve and you are feeling well.  Contact your primary care provider if your symptoms last more than a few more days, if diarrhea worsens or becomes bloody, or if you develop a fever or increasing abdominal pain.   Go to the emergency room if you experience signs of dehydration such as dizziness, dry mouth, reduced urination, or persistent vomiting, or if rectal bleeding becomes heavy or persistent.      ED Prescriptions     Medication Sig Dispense Auth. Provider   ondansetron  (ZOFRAN -ODT) 8 MG disintegrating tablet Take 1 tablet (8 mg total) by mouth every 8 (eight) hours as needed for nausea or vomiting. 12 tablet Iola Lukes, FNP      PDMP not reviewed this encounter.   Iola Lukes, [place] 04/24/24 1216

## 2024-10-22 ENCOUNTER — Emergency Department (HOSPITAL_BASED_OUTPATIENT_CLINIC_OR_DEPARTMENT_OTHER)
Admission: EM | Admit: 2024-10-22 | Discharge: 2024-10-22 | Disposition: A | Discharge status: Home / Self Care | Payer: Self-pay | Attending: Emergency Medicine | Admitting: Emergency Medicine

## 2024-10-22 ENCOUNTER — Other Ambulatory Visit: Payer: Self-pay

## 2024-10-22 DIAGNOSIS — G44209 Tension-type headache, unspecified, not intractable: Secondary | ICD-10-CM | POA: Insufficient documentation

## 2024-10-22 LAB — CBC WITH DIFFERENTIAL/PLATELET
Abs Immature Granulocytes: 0.03 K/uL (ref 0.00–0.07)
Basophils Absolute: 0 K/uL (ref 0.0–0.1)
Basophils Relative: 0 %
Eosinophils Absolute: 0.6 K/uL — ABNORMAL HIGH (ref 0.0–0.5)
Eosinophils Relative: 6 %
HCT: 37.4 % — ABNORMAL LOW (ref 39.0–52.0)
Hemoglobin: 13.2 g/dL (ref 13.0–17.0)
Immature Granulocytes: 0 %
Lymphocytes Relative: 31 %
Lymphs Abs: 2.9 K/uL (ref 0.7–4.0)
MCH: 22.3 pg — ABNORMAL LOW (ref 26.0–34.0)
MCHC: 35.3 g/dL (ref 30.0–36.0)
MCV: 63.3 fL — ABNORMAL LOW (ref 80.0–100.0)
Monocytes Absolute: 0.8 K/uL (ref 0.1–1.0)
Monocytes Relative: 9 %
Neutro Abs: 5 K/uL (ref 1.7–7.7)
Neutrophils Relative %: 54 %
Platelets: 239 K/uL (ref 150–400)
RBC: 5.91 MIL/uL — ABNORMAL HIGH (ref 4.22–5.81)
RDW: 16.6 % — ABNORMAL HIGH (ref 11.5–15.5)
WBC: 9.3 K/uL (ref 4.0–10.5)
nRBC: 0 % (ref 0.0–0.2)

## 2024-10-22 LAB — BASIC METABOLIC PANEL WITH GFR
Anion gap: 10 (ref 5–15)
BUN: 14 mg/dL (ref 6–20)
CO2: 24 mmol/L (ref 22–32)
Calcium: 9.4 mg/dL (ref 8.9–10.3)
Chloride: 104 mmol/L (ref 98–111)
Creatinine, Ser: 1.08 mg/dL (ref 0.61–1.24)
GFR, Estimated: >60 mL/min
Glucose, Bld: 89 mg/dL (ref 70–99)
Potassium: 4 mmol/L (ref 3.5–5.1)
Sodium: 138 mmol/L (ref 135–145)

## 2024-10-22 MED ORDER — DIPHENHYDRAMINE HCL 50 MG/ML IJ SOLN
12.5000 mg | Freq: Once | INTRAMUSCULAR | Status: AC
  Administered 2024-10-22: 12.5 mg via INTRAVENOUS
  Filled 2024-10-22: qty 1

## 2024-10-22 MED ORDER — PROCHLORPERAZINE EDISYLATE 10 MG/2ML IJ SOLN
10.0000 mg | Freq: Once | INTRAMUSCULAR | Status: AC
  Administered 2024-10-22: 10 mg via INTRAVENOUS
  Filled 2024-10-22: qty 2

## 2024-10-22 MED ORDER — KETOROLAC TROMETHAMINE 15 MG/ML IJ SOLN: 15.0000 mg | Freq: Once | INTRAMUSCULAR | Status: DC

## 2024-10-22 MED ORDER — LACTATED RINGERS IV BOLUS
1000.0000 mL | Freq: Once | INTRAVENOUS | Status: AC
  Administered 2024-10-22: 1000 mL via INTRAVENOUS

## 2024-10-22 MED ORDER — ACETAMINOPHEN 500 MG PO TABS
1000.0000 mg | ORAL_TABLET | Freq: Once | ORAL | Status: AC
  Administered 2024-10-22: 1000 mg via ORAL
  Filled 2024-10-22: qty 2

## 2024-10-22 NOTE — ED Notes (Signed)
 Pt d/c instructions, medications, and follow-up care reviewed with pt. Pt verbalized understanding and had no further questions at time of d/c. Pt CA&Ox4, ambulatory, and in NAD at time of d/c

## 2024-10-22 NOTE — ED Triage Notes (Signed)
 Pt c/o gradual onset frontal headache for one week. Pt denies unilateral weakness/vision changes.

## 2024-10-22 NOTE — ED Provider Notes (Signed)
 "  EMERGENCY DEPARTMENT AT [facility] Provider Note   CSN: [id number] Arrival date & time: 10/22/24  9084     Patient presents with: Migraine   Alan Foley is a 29 y.o. male with no significant past medical history presents with concern for a gradual onset frontal headache that began last week.  Reports that he has been trying Advil  at home which will somewhat improve his headache, but it will not completely resolve.  He has associated photosensitivity and sensitivity to sounds.  Denies any nausea or vomiting.  No double vision or other vision changes.  Denies any pain in his neck, no fevers.  Denies any head trauma.  Reports that he has had similar headaches, but they usually resolve with home medications.    Migraine Associated symptoms include headaches.       Prior to Admission medications  Medication Sig Start Date End Date Taking? Authorizing Provider  ondansetron  (ZOFRAN -ODT) 8 MG disintegrating tablet Take 1 tablet (8 mg total) by mouth every 8 (eight) hours as needed for nausea or vomiting. 04/24/24   Iola Lukes, FNP    Allergies: Patient has no known allergies.    Review of Systems  Neurological:  Positive for headaches.    Updated Vital Signs BP 137/82 (BP Location: Left Arm)   Pulse 86   Temp 98 F (36.7 C)   Resp 16   Ht 5' 9 (1.753 m)   Wt 109 kg   SpO2 98%   BMI 35.49 kg/m   Physical Exam Vitals and nursing note reviewed.  Constitutional:      General: He is not in acute distress.    Appearance: He is well-developed.  HENT:     Head: Normocephalic and atraumatic.  Eyes:     Extraocular Movements: Extraocular movements intact.     Conjunctiva/sclera: Conjunctivae normal.     Pupils: Pupils are equal, round, and reactive to light.  Cardiovascular:     Rate and Rhythm: Normal rate and regular rhythm.     Heart sounds: No murmur heard. Pulmonary:     Effort: Pulmonary effort is normal. No respiratory distress.      Breath sounds: Normal breath sounds.  Abdominal:     Palpations: Abdomen is soft.     Tenderness: There is no abdominal tenderness.  Musculoskeletal:        General: No swelling.     Cervical back: Normal range of motion and neck supple. No rigidity.  Skin:    General: Skin is warm and dry.     Capillary Refill: Capillary refill takes less than 2 seconds.  Neurological:     General: No focal deficit present.     Mental Status: He is alert and oriented to person, place, and time.  Psychiatric:        Mood and Affect: Mood normal.     (all labs ordered are listed, but only abnormal results are displayed) Labs Reviewed  CBC WITH DIFFERENTIAL/PLATELET - Abnormal; Notable for the following components:      Result Value   RBC 5.91 (*)    HCT 37.4 (*)    MCV 63.3 (*)    MCH 22.3 (*)    RDW 16.6 (*)    Eosinophils Absolute 0.6 (*)    All other components within normal limits  BASIC METABOLIC PANEL WITH GFR    EKG: None  Radiology: No results found.   Procedures   Medications Ordered in the ED  prochlorperazine  (COMPAZINE ) injection 10  mg (10 mg Intravenous Given 10/22/24 1108)  diphenhydrAMINE  (BENADRYL ) injection 12.5 mg (12.5 mg Intravenous Given 10/22/24 1107)  acetaminophen  (TYLENOL ) tablet 1,000 mg (1,000 mg Oral Given 10/22/24 1103)  lactated ringers  bolus 1,000 mL (0 mLs Intravenous Stopped 10/22/24 1210)    Clinical Course as of 10/22/24 1216  Wed Oct 22, 2024  1046 Pt reports taking 800mg  advil  at 8am this morning, will hold off on toradol  [AF]  1212 Upon reevaluation, patient reports resolution of his headache.  Reports he is feeling much better. [AF]    Clinical Course User Index [AF] Veta Palma, PA-C                                 Medical Decision Making Amount and/or Complexity of Data Reviewed Labs: ordered.  Risk OTC drugs. Prescription drug management.     Differential diagnosis includes but is not limited to Tension headache, migraine,  temporal arteritis, trigeminal neuralgia, cluster headache, meningitis, concussion, intracranial mass, intracranial hemorrhage, carbon monoxide poisoning, sinus venous thrombosis   ED Course:  Upon initial evaluation, patient is well-appearing, normal vital signs.  Reporting gradual onset frontal headache that began 1 week ago.  No neurologic deficits noted on exam.  Denies any head trauma. No fevers or nuchal rigidity to suggest meningitis. No indication for head CT at this time. Suspect frontal headache, will proceed with Compazine , Benadryl , Tylenol , LR bolus for treatment of headache.  Patient reports he took Advil  about 2 hours prior to arrival, will hold off on Toradol .  Labs Ordered: I Ordered, and personally interpreted labs.  The pertinent results include:   BMP within normal limits CBC without leukocytosis  Medications Given: Compazine  Benadryl  Tylenol  LR bolus  Upon re-evaluation, patient reports headache has completely resolved.  He remains with stable vitals. CBC and BMP unremarkable. Headache is consistent with tension type headache.  No red flag symptoms.  Patient stable and appropriate for discharge home.    Impression: Tension headache, resolved  Disposition:  The patient was discharged home with instructions to take Tylenol  and ibuprofen  as needed for headache.  Follow-up with PCP if headaches are not well-controlled with Tylenol  and ibuprofen  at home or becoming more frequent. Return precautions given and patient verbalized understanding.  This chart was dictated using voice recognition software, Dragon. Despite the best efforts of this provider to proofread and correct errors, errors may still occur which can change documentation meaning.       Final diagnoses:  Tension headache    ED Discharge Orders     None          Veta Palma, [place] 10/22/24 1217    Armenta Canning, MD 10/23/24 [phone]  "

## 2024-10-22 NOTE — Discharge Instructions (Signed)
 You were seen today for your headache No specific cause was found today for your headache. It may have been a migraine or other cause of headache. Stress, anxiety, fatigue, weather changes are common triggers for headaches.   You may take up to 1000mg  of tylenol  every 6 hours as needed for headache. Do not take more then 4g per day.   You may use up to 600mg  ibuprofen  (Advil ) every 6 hours as needed for headache.  Do not exceed 2.4g of ibuprofen  per day.   Tests performed today include: We checked your blood counts which were overall unremarkable.  We checked your electrolytes and kidney function which were normal.   Follow-up instructions: Please follow-up with your primary care provider if your headaches are not being controlled with over-the-counter medications like Tylenol  and ibuprofen  or becoming more frequent.  Return instructions:  Please return to the Emergency Department if you experience worsening symptoms. Return if the medications do not resolve your headache, if it recurs, or if you have multiple episodes of vomiting or cannot keep down fluids. Return if you have a change from your usual headache. RETURN IMMEDIATELY IF you: Develop a sudden, severe headache Develop confusion or become poorly responsive or faint Develop a fever above 100.47F or problem breathing Have a change in speech, vision, swallowing, or understanding Develop new weakness, numbness, tingling, incoordination in your arms or legs Have a seizure Have lots of pain in your neck Please return if you have any other emergent concerns.

## 2024-10-30 ENCOUNTER — Ambulatory Visit: Payer: Self-pay

## 2024-10-31 ENCOUNTER — Ambulatory Visit
Admission: RE | Admit: 2024-10-31 | Discharge: 2024-10-31 | Disposition: A | Discharge status: Home / Self Care | Payer: Self-pay | Source: Ambulatory Visit

## 2024-10-31 VITALS — BP 128/91 | HR 86 | Temp 97.9°F | Resp 14

## 2024-10-31 DIAGNOSIS — R519 Headache, unspecified: Secondary | ICD-10-CM

## 2024-10-31 DIAGNOSIS — Z77098 Contact with and (suspected) exposure to other hazardous, chiefly nonmedicinal, chemicals: Secondary | ICD-10-CM

## 2024-10-31 NOTE — ED Triage Notes (Signed)
 Pt reports intermittent headaches that have been recurrent for the last 2-3 weeks. No other associated symptoms. Denies photosensitivity, but notes slight blurred visions during the worst headaches. Has been checking blood pressures at home during episodes and it has been within normal range. No med use for headaches. They resolve with rest. Pt reports bad sleeping habits as possible trigger. Notes he works around a lot of chemicals and he notices headaches usually start and are the worst at work. Anterior headaches that pulse per pt.

## 2024-10-31 NOTE — ED Provider Notes (Signed)
 " [facility]    CSN: [id number] Arrival date & time: 10/31/24  0854      History   Chief Complaint Chief Complaint  Patient presents with   Headache    Entered by patient    HPI Alan Foley is a 29 y.o. male.   Pt presents today due to headaches that have started in the past 2-3 months since starting his job. Pt states that he is exposed to chemicals at work and states that PPE provided by his job is in effective. Pt states that headaches start at work but resolve on their own with prolonged time away from work. Pt denies taking medications for headaches because medication today is not made the way it was made back in the day. Pt denies headache in office today, patient is wanting to get these symptoms documented.   The history is provided by the patient.  Headache   Past Medical History:  Diagnosis Date   Asthma    Enlarged heart     There are no active problems to display for this patient.   Past Surgical History:  Procedure Laterality Date   FINGER SURGERY         Home Medications    Prior to Admission medications  Medication Sig Start Date End Date Taking? Authorizing Provider  ondansetron  (ZOFRAN -ODT) 8 MG disintegrating tablet Take 1 tablet (8 mg total) by mouth every 8 (eight) hours as needed for nausea or vomiting. Patient not taking: Reported on 10/31/2024 04/24/24   Iola Lukes, FNP    Family History Family History  Family history unknown: Yes    Social History Social History[1]   Allergies   Patient has no known allergies.   Review of Systems Review of Systems  Neurological:  Positive for headaches.     Physical Exam Triage Vital Signs ED Triage Vitals  Encounter Vitals Group     BP 10/31/24 0907 (!) 128/91     Girls Systolic BP Percentile --      Girls Diastolic BP Percentile --      Boys Systolic BP Percentile --      Boys Diastolic BP Percentile --      Pulse Rate 10/31/24 0907 86     Resp 10/31/24 0907  14     Temp 10/31/24 0907 97.9 F (36.6 C)     Temp Source 10/31/24 0907 Oral     SpO2 10/31/24 0907 96 %     Weight --      Height --      Head Circumference --      Peak Flow --      Pain Score 10/31/24 0909 0     Pain Loc --      Pain Education --      Exclude from Growth Chart --    No data found.  Updated Vital Signs BP (!) 128/91 (BP Location: Left Arm)   Pulse 86   Temp 97.9 F (36.6 C) (Oral)   Resp 14   SpO2 96%   Visual Acuity Right Eye Distance:   Left Eye Distance:   Bilateral Distance:    Right Eye Near:   Left Eye Near:    Bilateral Near:     Physical Exam Vitals and nursing note reviewed.  Constitutional:      General: He is not in acute distress.    Appearance: He is well-developed. He is not ill-appearing, toxic-appearing or diaphoretic.  Eyes:     General: No  scleral icterus. Cardiovascular:     Rate and Rhythm: Normal rate and regular rhythm.     Heart sounds: Normal heart sounds.  Pulmonary:     Effort: Pulmonary effort is normal. No respiratory distress.     Breath sounds: Normal breath sounds. No wheezing or rhonchi.  Skin:    General: Skin is warm.  Neurological:     Mental Status: He is alert and oriented to person, place, and time.  Psychiatric:        Mood and Affect: Mood normal.        Behavior: Behavior normal.      UC Treatments / Results  Labs (all labs ordered are listed, but only abnormal results are displayed) Labs Reviewed - No data to display  EKG   Radiology No results found.  Procedures Procedures (including critical care time)  Medications Ordered in UC Medications - No data to display  Initial Impression / Assessment and Plan / UC Course  I have reviewed the triage vital signs and the nursing notes.  Pertinent labs & imaging results that were available during my care of the patient were reviewed by me and considered in my medical decision making (see chart for details).     Final Clinical  Impressions(s) / UC Diagnoses   Final diagnoses:  Nonintractable headache, unspecified chronicity pattern, unspecified headache type  Occupational exposure to chemicals     Discharge Instructions      Please continue use of personal protective equipment provided by work until you can obtain your own.   It is recommended that you use ibuprofen  or Tylenol  as needed if you have a headache.  Give it 24-48 hrs for your completed chart to be able to be viewed on mychart.    ED Prescriptions   None    PDMP not reviewed this encounter.    [1]  Social History Tobacco Use   Smoking status: Never   Smokeless tobacco: Never  Vaping Use   Vaping status: Never Used  Substance Use Topics   Alcohol use: No    Comment: occasionally   Drug use: Yes    Types: Marijuana    Comment: occasionally     Andra Corean BROCKS, PA-C 10/31/24 9047  "

## 2024-10-31 NOTE — Discharge Instructions (Addendum)
 Please continue use of personal protective equipment provided by work until you can obtain your own.   It is recommended that you use ibuprofen  or Tylenol  as needed if you have a headache.  Give it 24-48 hrs for your completed chart to be able to be viewed on mychart.

## 2024-11-01 ENCOUNTER — Emergency Department (HOSPITAL_COMMUNITY): Payer: Self-pay

## 2024-11-01 ENCOUNTER — Emergency Department (HOSPITAL_COMMUNITY)
Admission: EM | Admit: 2024-11-01 | Discharge: 2024-11-01 | Disposition: A | Discharge status: Home / Self Care | Payer: Self-pay | Attending: Emergency Medicine | Admitting: Emergency Medicine

## 2024-11-01 ENCOUNTER — Encounter (HOSPITAL_COMMUNITY): Payer: Self-pay | Admitting: *Deleted

## 2024-11-01 ENCOUNTER — Other Ambulatory Visit: Payer: Self-pay

## 2024-11-01 DIAGNOSIS — S060XAA Concussion with loss of consciousness status unknown, initial encounter: Secondary | ICD-10-CM

## 2024-11-01 DIAGNOSIS — M791 Myalgia, unspecified site: Secondary | ICD-10-CM

## 2024-11-01 DIAGNOSIS — S060X0A Concussion without loss of consciousness, initial encounter: Secondary | ICD-10-CM | POA: Insufficient documentation

## 2024-11-01 DIAGNOSIS — J45909 Unspecified asthma, uncomplicated: Secondary | ICD-10-CM | POA: Insufficient documentation

## 2024-11-01 DIAGNOSIS — S0083XA Contusion of other part of head, initial encounter: Secondary | ICD-10-CM | POA: Insufficient documentation

## 2024-11-01 DIAGNOSIS — Y92410 Unspecified street and highway as the place of occurrence of the external cause: Secondary | ICD-10-CM | POA: Insufficient documentation

## 2024-11-01 DIAGNOSIS — M25561 Pain in right knee: Secondary | ICD-10-CM | POA: Insufficient documentation

## 2024-11-01 LAB — CBC WITH DIFFERENTIAL/PLATELET
Basophils Absolute: 0 10*3/uL (ref 0.0–0.1)
Basophils Relative: 0 %
Eosinophils Absolute: 0.4 10*3/uL (ref 0.0–0.5)
Eosinophils Relative: 4 %
HCT: 42.4 % (ref 39.0–52.0)
Hemoglobin: 14.8 g/dL (ref 13.0–17.0)
Lymphocytes Relative: 40 %
Lymphs Abs: 4 10*3/uL (ref 0.7–4.0)
MCH: 22.3 pg — ABNORMAL LOW (ref 26.0–34.0)
MCHC: 34.9 g/dL (ref 30.0–36.0)
MCV: 63.9 fL — ABNORMAL LOW (ref 80.0–100.0)
Monocytes Absolute: 0.4 10*3/uL (ref 0.1–1.0)
Monocytes Relative: 4 %
Neutro Abs: 5.2 10*3/uL (ref 1.7–7.7)
Neutrophils Relative %: 52 %
Platelets: 255 10*3/uL (ref 150–400)
RBC: 6.64 MIL/uL — ABNORMAL HIGH (ref 4.22–5.81)
RDW: 16.6 % — ABNORMAL HIGH (ref 11.5–15.5)
WBC: 10 10*3/uL (ref 4.0–10.5)
nRBC: 0.3 % — ABNORMAL HIGH (ref 0.0–0.2)

## 2024-11-01 LAB — COMPREHENSIVE METABOLIC PANEL WITH GFR
ALT: 36 U/L (ref 0–44)
AST: 28 U/L (ref 15–41)
Albumin: 4.6 g/dL (ref 3.5–5.0)
Alkaline Phosphatase: 73 U/L (ref 38–126)
Anion gap: 12 (ref 5–15)
BUN: 10 mg/dL (ref 6–20)
CO2: 21 mmol/L — ABNORMAL LOW (ref 22–32)
Calcium: 9.5 mg/dL (ref 8.9–10.3)
Chloride: 104 mmol/L (ref 98–111)
Creatinine, Ser: 1.14 mg/dL (ref 0.61–1.24)
GFR, Estimated: >60 mL/min
Glucose, Bld: 118 mg/dL — ABNORMAL HIGH (ref 70–99)
Potassium: 3.9 mmol/L (ref 3.5–5.1)
Sodium: 138 mmol/L (ref 135–145)
Total Bilirubin: 0.9 mg/dL (ref 0.0–1.2)
Total Protein: 7.4 g/dL (ref 6.5–8.1)

## 2024-11-01 MED ORDER — CYCLOBENZAPRINE HCL 10 MG PO TABS: 10.0000 mg | ORAL_TABLET | Freq: Two times a day (BID) | ORAL | 0 refills | Status: AC | PRN

## 2024-11-01 MED ORDER — FENTANYL CITRATE (PF) 50 MCG/ML IJ SOSY
50.0000 ug | PREFILLED_SYRINGE | Freq: Once | INTRAMUSCULAR | Status: AC
  Administered 2024-11-01: 50 ug via INTRAVENOUS
  Filled 2024-11-01: qty 1

## 2024-11-01 MED ORDER — LIDOCAINE 5 % EX PTCH: 1.0000 | MEDICATED_PATCH | CUTANEOUS | 0 refills | Status: AC

## 2024-11-01 NOTE — ED Notes (Signed)
 X-ray at bedside
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
## 2024-11-01 NOTE — ED Notes (Signed)
 Patient transported to CT

## 2024-11-01 NOTE — ED Notes (Signed)
 Po challenge tolerated. Pt ambulated to restroom without assistance. Pt asking to be discharged so he can go see his wife. Provided pt with paper scrubs and socks
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
## 2024-11-05 ENCOUNTER — Ambulatory Visit: Payer: Self-pay

## 2024-11-06 ENCOUNTER — Ambulatory Visit (HOSPITAL_COMMUNITY)
Admission: EM | Admit: 2024-11-06 | Discharge: 2024-11-06 | Disposition: A | Discharge status: Home / Self Care | Payer: Self-pay

## 2024-11-06 ENCOUNTER — Encounter (HOSPITAL_COMMUNITY): Payer: Self-pay

## 2024-11-06 DIAGNOSIS — Z87820 Personal history of traumatic brain injury: Secondary | ICD-10-CM
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
